# Patient Record
Sex: Female | Born: 1950 | Race: Black or African American | Hispanic: No | Marital: Single | Smoking: Former smoker
Health system: Southern US, Community
[De-identification: ages and names within clinical notes are randomized; demographics above are authoritative.]

## PROBLEM LIST (undated history)

## (undated) DIAGNOSIS — R413 Other amnesia: Secondary | ICD-10-CM

## (undated) DIAGNOSIS — I499 Cardiac arrhythmia, unspecified: Secondary | ICD-10-CM

## (undated) DIAGNOSIS — I341 Nonrheumatic mitral (valve) prolapse: Secondary | ICD-10-CM

## (undated) DIAGNOSIS — E0590 Thyrotoxicosis, unspecified without thyrotoxic crisis or storm: Secondary | ICD-10-CM

## (undated) DIAGNOSIS — J189 Pneumonia, unspecified organism: Secondary | ICD-10-CM

## (undated) DIAGNOSIS — E782 Mixed hyperlipidemia: Secondary | ICD-10-CM

## (undated) DIAGNOSIS — G4730 Sleep apnea, unspecified: Secondary | ICD-10-CM

## (undated) DIAGNOSIS — Z9889 Other specified postprocedural states: Secondary | ICD-10-CM

## (undated) DIAGNOSIS — J45909 Unspecified asthma, uncomplicated: Secondary | ICD-10-CM

## (undated) DIAGNOSIS — K219 Gastro-esophageal reflux disease without esophagitis: Secondary | ICD-10-CM

## (undated) DIAGNOSIS — N8110 Cystocele, unspecified: Secondary | ICD-10-CM

## (undated) DIAGNOSIS — F419 Anxiety disorder, unspecified: Secondary | ICD-10-CM

## (undated) DIAGNOSIS — K635 Polyp of colon: Secondary | ICD-10-CM

## (undated) DIAGNOSIS — R42 Dizziness and giddiness: Secondary | ICD-10-CM

## (undated) DIAGNOSIS — F32A Depression, unspecified: Secondary | ICD-10-CM

## (undated) DIAGNOSIS — R519 Headache, unspecified: Secondary | ICD-10-CM

## (undated) DIAGNOSIS — R911 Solitary pulmonary nodule: Secondary | ICD-10-CM

## (undated) DIAGNOSIS — N840 Polyp of corpus uteri: Secondary | ICD-10-CM

## (undated) DIAGNOSIS — J849 Interstitial pulmonary disease, unspecified: Secondary | ICD-10-CM

## (undated) DIAGNOSIS — I10 Essential (primary) hypertension: Secondary | ICD-10-CM

## (undated) DIAGNOSIS — I4891 Unspecified atrial fibrillation: Secondary | ICD-10-CM

## (undated) DIAGNOSIS — U071 COVID-19: Secondary | ICD-10-CM

## (undated) DIAGNOSIS — E559 Vitamin D deficiency, unspecified: Secondary | ICD-10-CM

## (undated) DIAGNOSIS — T8859XA Other complications of anesthesia, initial encounter: Secondary | ICD-10-CM

## (undated) DIAGNOSIS — J449 Chronic obstructive pulmonary disease, unspecified: Secondary | ICD-10-CM

## (undated) DIAGNOSIS — M1990 Unspecified osteoarthritis, unspecified site: Secondary | ICD-10-CM

## (undated) DIAGNOSIS — N189 Chronic kidney disease, unspecified: Secondary | ICD-10-CM

## (undated) HISTORY — PX: FOOT SURGERY: SHX648

## (undated) HISTORY — PX: COLONOSCOPY: SHX174

## (undated) HISTORY — PX: TUBAL LIGATION: SHX77

## (undated) HISTORY — PX: SHOULDER ARTHROSCOPY: SHX128

## (undated) HISTORY — PX: KNEE SURGERY: SHX244

## (undated) HISTORY — PX: ROTATOR CUFF REPAIR: SHX139

## (undated) HISTORY — PX: KNEE ARTHROSCOPY: SUR90

---

## 2021-02-04 ENCOUNTER — Other Ambulatory Visit: Payer: Self-pay | Admitting: Orthopaedic Surgery

## 2021-02-04 DIAGNOSIS — Z01818 Encounter for other preprocedural examination: Secondary | ICD-10-CM

## 2021-02-12 ENCOUNTER — Encounter (HOSPITAL_COMMUNITY): Payer: Self-pay

## 2021-02-12 NOTE — Patient Instructions (Addendum)
DUE TO COVID-19 ONLY ONE VISITOR IS ALLOWED TO COME WITH YOU AND STAY IN THE WAITING ROOM ONLY DURING PRE OP AND PROCEDURE.   **NO VISITORS ARE ALLOWED IN THE SHORT STAY AREA OR RECOVERY ROOM!!**  IF YOU WILL BE ADMITTED INTO THE HOSPITAL YOU ARE ALLOWED ONLY TWO SUPPORT PEOPLE DURING VISITATION HOURS ONLY (10AM -8PM)   . The support person(s) may change daily. . The support person(s) must pass our screening, gel in and out, and wear a mask at all times, including in the patient's room. . Patients must also wear a mask when staff or their support person are in the room.  No visitors under the age of 68. Any visitor under the age of 68 must be accompanied by an adult.    COVID SWAB TESTING MUST BE COMPLETED ON:  Friday, 02-15-21 @ 12:15 PM   [address] [place], [place] [id number]  (Must self quarantine after testing. Follow instructions on handout.)        Your procedure is scheduled on:  Tuesday, 02-19-21   Report to [facility] Main  Entrance   Report to admitting at 10:00 AM   Call this number if you have problems the morning of surgery [phone]   Do not eat food :After Midnight.   May have liquids until 9:30 AM day of surgery  CLEAR LIQUID DIET  Foods Allowed                                                                     Foods Excluded  Water, Black Coffee and tea, regular and decaf             liquids that you cannot  Plain Jell-O in any flavor  (No red)                                    see through such as: Fruit ices (not with fruit pulp)                                      milk, soups, orange juice              Iced Popsicles (No red)                                      All solid food                                   Apple juices Sports drinks like Gatorade (No red) Lightly seasoned clear broth or consume(fat free) Sugar, honey syrup     Complete one Ensure drink the morning of surgery at 9:30 AM  the day of surgery.     1. The day of surgery:   ? Drink ONE (1) Pre-Surgery Clear Ensure or G2 by am the morning of surgery. Drink in one sitting. Do not sip.  ? This drink was given to you during  your hospital  pre-op appointment visit. ? Nothing else to drink after completing the  Pre-Surgery Clear Ensure or G2.          If you have questions, please contact your surgeon's office.     Oral Hygiene is also important to reduce your risk of infection.                                    Remember - BRUSH YOUR TEETH THE MORNING OF SURGERY WITH YOUR REGULAR TOOTHPASTE   Do NOT smoke after Midnight   Take these medicines the morning of surgery with A SIP OF WATER:  Amlodipine, Citalopram, PTU, Rosuvastatin.  Okay to use Albuterol  inhaler and bring with you day of surgery                                You may not have any metal on your body including hair pins, jewelry, and body piercings             Do not wear make-up, lotions, powders, perfumes/cologne, or deodorant             Do not wear nail polish.  Do not shave  48 hours prior to surgery.     Do not bring valuables to the hospital. [facility] IS NOT RESPONSIBLE   FOR VALUABLES.   Contacts, dentures or bridgework may not be worn into surgery.   Bring small overnight bag day of surgery.   Bring CPAP mask and tubing day of surgery                Please read over the following fact sheets you were given: IF YOU HAVE QUESTIONS ABOUT YOUR PRE OP INSTRUCTIONS PLEASE CALL  [phone] [facility] - Preparing for Surgery Before surgery, you can play an important role.  Because skin is not sterile, your skin needs to be as free of germs as possible.  You can reduce the number of germs on your skin by washing with CHG (chlorahexidine gluconate) soap before surgery.  CHG is an antiseptic cleaner which kills germs and bonds with the skin to continue killing germs even after washing. Please DO NOT use if you have an allergy to CHG or antibacterial soaps.  If your skin becomes  reddened/irritated stop using the CHG and inform your nurse when you arrive at Short Stay. Do not shave (including legs and underarms) for at least 48 hours prior to the first CHG shower.  You may shave your face/neck.  Please follow these instructions carefully:  1.  Shower with CHG Soap the night before surgery and the  morning of surgery.  2.  If you choose to wash your hair, wash your hair first as usual with your normal  shampoo.  3.  After you shampoo, rinse your hair and body thoroughly to remove the shampoo.                             4.  Use CHG as you would any other liquid soap.  You can apply chg directly to the skin and wash.  Gently with a scrungie or clean washcloth.  5.  Apply the CHG Soap to your body ONLY FROM THE NECK DOWN.   Do   not use on  face/ open                           Wound or open sores. Avoid contact with eyes, ears mouth and   genitals (private parts).                       Wash face,  Genitals (private parts) with your normal soap.             6.  Wash thoroughly, paying special attention to the area where your    surgery  will be performed.  7.  Thoroughly rinse your body with warm water from the neck down.  8.  DO NOT shower/wash with your normal soap after using and rinsing off the CHG Soap.                9.  Pat yourself dry with a clean towel.            10.  Wear clean pajamas.            11.  Place clean sheets on your bed the night of your first shower and do not  sleep with pets. Day of Surgery : Do not apply any lotions/deodorants the morning of surgery.  Please wear clean clothes to the hospital/surgery center.  FAILURE TO FOLLOW THESE INSTRUCTIONS MAY RESULT IN THE CANCELLATION OF YOUR SURGERY  PATIENT SIGNATURE_________________________________  NURSE SIGNATURE__________________________________  ________________________________________________________________________   Rogelia Mire  An incentive spirometer is a tool that can help  keep your lungs clear and active. This tool measures how well you are filling your lungs with each breath. Taking long deep breaths may help reverse or decrease the chance of developing breathing (pulmonary) problems (especially infection) following:  A long period of time when you are unable to move or be active. BEFORE THE PROCEDURE   If the spirometer includes an indicator to show your best effort, your nurse or respiratory therapist will set it to a desired goal.  If possible, sit up straight or lean slightly forward. Try not to slouch.  Hold the incentive spirometer in an upright position. INSTRUCTIONS FOR USE  1. Sit on the edge of your bed if possible, or sit up as far as you can in bed or on a chair. 2. Hold the incentive spirometer in an upright position. 3. Breathe out normally. 4. Place the mouthpiece in your mouth and seal your lips tightly around it. 5. Breathe in slowly and as deeply as possible, raising the piston or the ball toward the top of the column. 6. Hold your breath for 3-5 seconds or for as long as possible. Allow the piston or ball to fall to the bottom of the column. 7. Remove the mouthpiece from your mouth and breathe out normally. 8. Rest for a few seconds and repeat Steps 1 through 7 at least 10 times every 1-2 hours when you are awake. Take your time and take a few normal breaths between deep breaths. 9. The spirometer may include an indicator to show your best effort. Use the indicator as a goal to work toward during each repetition. 10. After each set of 10 deep breaths, practice coughing to be sure your lungs are clear. If you have an incision (the cut made at the time of surgery), support your incision when coughing by placing a pillow or rolled up towels firmly against it. Once you are able to get out  of bed, walk around indoors and cough well. You may stop using the incentive spirometer when instructed by your caregiver.  RISKS AND COMPLICATIONS  Take your  time so you do not get dizzy or light-headed.  If you are in pain, you may need to take or ask for pain medication before doing incentive spirometry. It is harder to take a deep breath if you are having pain. AFTER USE  Rest and breathe slowly and easily.  It can be helpful to keep track of a log of your progress. Your caregiver can provide you with a simple table to help with this. If you are using the spirometer at home, follow these instructions: SEEK MEDICAL CARE IF:   You are having difficultly using the spirometer.  You have trouble using the spirometer as often as instructed.  Your pain medication is not giving enough relief while using the spirometer.  You develop fever of 100.5 F (38.1 C) or higher. SEEK IMMEDIATE MEDICAL CARE IF:   You cough up bloody sputum that had not been present before.  You develop fever of 102 F (38.9 C) or greater.  You develop worsening pain at or near the incision site. MAKE SURE YOU:   Understand these instructions.  Will watch your condition.  Will get help right away if you are not doing well or get worse. Document Released: 02/09/2007 Document Revised: 12/22/2011 Document Reviewed: 04/12/2007 [employer] Patient Information 2014 [employer], [place].   ________________________________________________________________________  WHAT IS A BLOOD TRANSFUSION? Blood Transfusion Information  A transfusion is the replacement of blood or some of its parts. Blood is made up of multiple cells which provide different functions.  Red blood cells carry oxygen and are used for blood loss replacement.  White blood cells fight against infection.  Platelets control bleeding.  Plasma helps clot blood.  Other blood products are available for specialized needs, such as hemophilia or other clotting disorders. BEFORE THE TRANSFUSION  Who gives blood for transfusions?   Healthy volunteers who are fully evaluated to make sure their blood is safe. This  is blood bank blood. Transfusion therapy is the safest it has ever been in the practice of medicine. Before blood is taken from a donor, a complete history is taken to make sure that person has no history of diseases nor engages in risky social behavior (examples are intravenous drug use or sexual activity with multiple partners). The donor's travel history is screened to minimize risk of transmitting infections, such as malaria. The donated blood is tested for signs of infectious diseases, such as HIV and hepatitis. The blood is then tested to be sure it is compatible with you in order to minimize the chance of a transfusion reaction. If you or a relative donates blood, this is often done in anticipation of surgery and is not appropriate for emergency situations. It takes many days to process the donated blood. RISKS AND COMPLICATIONS Although transfusion therapy is very safe and saves many lives, the main dangers of transfusion include:   Getting an infectious disease.  Developing a transfusion reaction. This is an allergic reaction to something in the blood you were given. Every precaution is taken to prevent this. The decision to have a blood transfusion has been considered carefully by your caregiver before blood is given. Blood is not given unless the benefits outweigh the risks. AFTER THE TRANSFUSION  Right after receiving a blood transfusion, you will usually feel much better and more energetic. This is especially true if your red  blood cells have gotten low (anemic). The transfusion raises the level of the red blood cells which carry oxygen, and this usually causes an energy increase.  The nurse administering the transfusion will monitor you carefully for complications. HOME CARE INSTRUCTIONS  No special instructions are needed after a transfusion. You may find your energy is better. Speak with your caregiver about any limitations on activity for underlying diseases you may have. SEEK  MEDICAL CARE IF:   Your condition is not improving after your transfusion.  You develop redness or irritation at the intravenous (IV) site. SEEK IMMEDIATE MEDICAL CARE IF:  Any of the following symptoms occur over the next 12 hours:  Shaking chills.  You have a temperature by mouth above 102 F (38.9 C), not controlled by medicine.  Chest, back, or muscle pain.  People around you feel you are not acting correctly or are confused.  Shortness of breath or difficulty breathing.  Dizziness and fainting.  You get a rash or develop hives.  You have a decrease in urine output.  Your urine turns a dark color or changes to pink, red, or brown. Any of the following symptoms occur over the next 10 days:  You have a temperature by mouth above 102 F (38.9 C), not controlled by medicine.  Shortness of breath.  Weakness after normal activity.  The white part of the eye turns yellow (jaundice).  You have a decrease in the amount of urine or are urinating less often.  Your urine turns a dark color or changes to pink, red, or brown. Document Released: 09/26/2000 Document Revised: 12/22/2011 Document Reviewed: 05/15/2008 [facility] Patient Information 2014 [place], [place].  _______________________________________________________________________

## 2021-02-12 NOTE — Progress Notes (Addendum)
COVID Vaccine Completed:  x3 Date COVID Vaccine completed: Has received booster: 08-29-20 COVID vaccine manufacturer: Pfizer     Date of COVID positive in last 90 days:  N/A  PCP - Edmonia Lynch in [place] Cardiologist - Landry Corporal, MD  Pulmonologist - Eual Fines in [facility]  Chest x-ray - 02-13-21 Epic EKG - 02-13-21 at Dr. Weber Cooks.  On chart Stress Test - 2017 Care Everywhere  ECHO - 03-14-20 Care Everywhere Cardiac Cath - 2019 Care Everywhere Pacemaker/ICD device last checked: Spinal Cord Stimulator: 30 day event monitor - 2019 Care Everywhere Cardiac MRI - 2019 Care Everywhere  Sleep Study - yes, +sleep apnea CPAP - Yes  Fasting Blood Sugar - N/A Checks Blood Sugar _____ times a day  Blood Thinner Instructions: Aspirin Instructions:  ASA 81 mg.  Patient remains on this. Last Dose:  Activity level:  Can go up a flight of stairs and perform activities of daily living without stopping and without symptoms of chest pain.  Patient does have SOB with asthma flares but denies at PAT.     Anesthesia review: Afib, s/p ablation 2019.   MVP, HTN, CKD, interstitial lung disease, asthma  Patient denies shortness of breath, fever, cough and chest pain at PAT appointment   Patient verbalized understanding of instructions that were given to them at the PAT appointment. Patient was also instructed that they will need to review over the PAT instructions again at home before surgery.

## 2021-02-13 ENCOUNTER — Encounter (HOSPITAL_COMMUNITY)
Admission: RE | Admit: 2021-02-13 | Discharge: 2021-02-13 | Disposition: A | Discharge status: Home / Self Care | Payer: Medicare Other | Source: Ambulatory Visit | Attending: Orthopaedic Surgery | Admitting: Orthopaedic Surgery

## 2021-02-13 ENCOUNTER — Other Ambulatory Visit (HOSPITAL_COMMUNITY)
Admission: RE | Admit: 2021-02-13 | Discharge: 2021-02-13 | Disposition: A | Discharge status: Home / Self Care | Payer: Medicare Other | Source: Ambulatory Visit

## 2021-02-13 ENCOUNTER — Other Ambulatory Visit: Payer: Self-pay

## 2021-02-13 ENCOUNTER — Encounter (HOSPITAL_COMMUNITY): Payer: Self-pay

## 2021-02-13 ENCOUNTER — Ambulatory Visit (HOSPITAL_COMMUNITY)
Admission: RE | Admit: 2021-02-13 | Discharge: 2021-02-13 | Disposition: A | Discharge status: Home / Self Care | Payer: Medicare Other | Source: Ambulatory Visit | Attending: Orthopaedic Surgery | Admitting: Orthopaedic Surgery

## 2021-02-13 DIAGNOSIS — Z01818 Encounter for other preprocedural examination: Secondary | ICD-10-CM | POA: Insufficient documentation

## 2021-02-13 DIAGNOSIS — Z20822 Contact with and (suspected) exposure to covid-19: Secondary | ICD-10-CM | POA: Insufficient documentation

## 2021-02-13 DIAGNOSIS — Z01812 Encounter for preprocedural laboratory examination: Secondary | ICD-10-CM | POA: Insufficient documentation

## 2021-02-13 HISTORY — DX: Nonrheumatic mitral (valve) prolapse: I34.1

## 2021-02-13 HISTORY — DX: Other amnesia: R41.3

## 2021-02-13 HISTORY — DX: Mixed hyperlipidemia: E78.2

## 2021-02-13 HISTORY — DX: Unspecified osteoarthritis, unspecified site: M19.90

## 2021-02-13 HISTORY — DX: Chronic kidney disease, unspecified: N18.9

## 2021-02-13 HISTORY — DX: Vitamin D deficiency, unspecified: E55.9

## 2021-02-13 HISTORY — DX: Polyp of colon: K63.5

## 2021-02-13 HISTORY — DX: Dizziness and giddiness: R42

## 2021-02-13 HISTORY — DX: Other specified postprocedural states: Z98.890

## 2021-02-13 HISTORY — DX: Unspecified atrial fibrillation: I48.91

## 2021-02-13 HISTORY — DX: Interstitial pulmonary disease, unspecified: J84.9

## 2021-02-13 HISTORY — DX: Other complications of anesthesia, initial encounter: T88.59XA

## 2021-02-13 HISTORY — DX: Headache, unspecified: R51.9

## 2021-02-13 HISTORY — DX: Thyrotoxicosis, unspecified without thyrotoxic crisis or storm: E05.90

## 2021-02-13 HISTORY — DX: Sleep apnea, unspecified: G47.30

## 2021-02-13 HISTORY — DX: COVID-19: U07.1

## 2021-02-13 HISTORY — DX: Cardiac arrhythmia, unspecified: I49.9

## 2021-02-13 HISTORY — DX: Cystocele, unspecified: N81.10

## 2021-02-13 HISTORY — DX: Unspecified asthma, uncomplicated: J45.909

## 2021-02-13 HISTORY — DX: Depression, unspecified: F32.A

## 2021-02-13 HISTORY — DX: Polyp of corpus uteri: N84.0

## 2021-02-13 HISTORY — DX: Chronic obstructive pulmonary disease, unspecified: J44.9

## 2021-02-13 HISTORY — DX: Anxiety disorder, unspecified: F41.9

## 2021-02-13 HISTORY — DX: Pneumonia, unspecified organism: J18.9

## 2021-02-13 HISTORY — DX: Essential (primary) hypertension: I10

## 2021-02-13 HISTORY — DX: Solitary pulmonary nodule: R91.1

## 2021-02-13 HISTORY — DX: Gastro-esophageal reflux disease without esophagitis: K21.9

## 2021-02-13 LAB — COMPREHENSIVE METABOLIC PANEL
ALT: 11 U/L (ref 0–44)
AST: 20 U/L (ref 15–41)
Albumin: 3.7 g/dL (ref 3.5–5.0)
Alkaline Phosphatase: 59 U/L (ref 38–126)
Anion gap: 9 (ref 5–15)
BUN: 15 mg/dL (ref 8–23)
CO2: 23 mmol/L (ref 22–32)
Calcium: 9.3 mg/dL (ref 8.9–10.3)
Chloride: 108 mmol/L (ref 98–111)
Creatinine, Ser: 1.2 mg/dL — ABNORMAL HIGH (ref 0.44–1.00)
GFR, Estimated: 49 mL/min — ABNORMAL LOW (ref >60)
Glucose, Bld: 101 mg/dL — ABNORMAL HIGH (ref 70–99)
Potassium: 4 mmol/L (ref 3.5–5.1)
Sodium: 140 mmol/L (ref 135–145)
Total Bilirubin: 0.6 mg/dL (ref 0.3–1.2)
Total Protein: 6.8 g/dL (ref 6.5–8.1)

## 2021-02-13 LAB — PROTIME-INR
INR: 1 (ref 0.8–1.2)
Prothrombin Time: 12.9 seconds (ref 11.4–15.2)

## 2021-02-13 LAB — CBC WITH DIFFERENTIAL/PLATELET
Abs Immature Granulocytes: 0.02 10*3/uL (ref 0.00–0.07)
Basophils Absolute: 0.1 10*3/uL (ref 0.0–0.1)
Basophils Relative: 1 %
Eosinophils Absolute: 0.2 10*3/uL (ref 0.0–0.5)
Eosinophils Relative: 2 %
HCT: 44.8 % (ref 36.0–46.0)
Hemoglobin: 13.3 g/dL (ref 12.0–15.0)
Immature Granulocytes: 0 %
Lymphocytes Relative: 24 %
Lymphs Abs: 1.8 10*3/uL (ref 0.7–4.0)
MCH: 25.9 pg — ABNORMAL LOW (ref 26.0–34.0)
MCHC: 29.7 g/dL — ABNORMAL LOW (ref 30.0–36.0)
MCV: 87.2 fL (ref 80.0–100.0)
Monocytes Absolute: 0.5 10*3/uL (ref 0.1–1.0)
Monocytes Relative: 6 %
Neutro Abs: 4.7 10*3/uL (ref 1.7–7.7)
Neutrophils Relative %: 67 %
Platelets: 226 10*3/uL (ref 150–400)
RBC: 5.14 MIL/uL — ABNORMAL HIGH (ref 3.87–5.11)
RDW: 13.3 % (ref 11.5–15.5)
WBC: 7.2 10*3/uL (ref 4.0–10.5)
nRBC: 0 % (ref 0.0–0.2)

## 2021-02-13 LAB — URINALYSIS, ROUTINE W REFLEX MICROSCOPIC
Bilirubin Urine: NEGATIVE
Glucose, UA: NEGATIVE mg/dL
Hgb urine dipstick: NEGATIVE
Ketones, ur: NEGATIVE mg/dL
Leukocytes,Ua: NEGATIVE
Nitrite: NEGATIVE
Protein, ur: NEGATIVE mg/dL
Specific Gravity, Urine: 1.026 (ref 1.005–1.030)
pH: 5 (ref 5.0–8.0)

## 2021-02-13 LAB — SARS CORONAVIRUS 2 (TAT 6-24 HRS): SARS Coronavirus 2: NEGATIVE

## 2021-02-13 LAB — SURGICAL PCR SCREEN
MRSA, PCR: NEGATIVE
Staphylococcus aureus: NEGATIVE

## 2021-02-13 LAB — APTT: aPTT: 31 seconds (ref 24–36)

## 2021-02-13 NOTE — H&P (Signed)
TOTAL KNEE ADMISSION H&P  Patient is being admitted for left total knee arthroplasty.  Subjective:  Chief Complaint:left knee pain.  HPI: Brandy Miller, 70 y.o. female, has a history of pain and functional disability in the left knee due to arthritis and has failed non-surgical conservative treatments for greater than 12 weeks to includeNSAID's and/or analgesics, corticosteriod injections, viscosupplementation injections, flexibility and strengthening excercises, use of assistive devices and weight reduction as appropriate.  Onset of symptoms was gradual, starting 5 years ago with gradually worsening course since that time. The patient noted no past surgery on the left knee(s).  Patient currently rates pain in the left knee(s) at 10 out of 10 with activity. Patient has night pain, worsening of pain with activity and weight bearing, pain that interferes with activities of daily living, crepitus and joint swelling.  Patient has evidence of subchondral cysts, subchondral sclerosis, periarticular osteophytes and joint space narrowing by imaging studies. There is no active infection.  There are no problems to display for this patient.  Past Medical History:  Diagnosis Date  . Afib (HCC)   . Anxiety   . Arthritis   . Asthma   . Chronic kidney disease   . Colon polyps   . COPD (chronic obstructive pulmonary disease) (HCC)   . Depression   . Dysrhythmia   . Endometrial polyp   . Female cystocele   . GERD (gastroesophageal reflux disease)   . H/O cardiac radiofrequency ablation    2019  . Headache   . Hypertension   . Hyperthyroidism   . ILD (interstitial lung disease) (HCC)   . Memory loss   . Mixed hyperlipidemia   . MVP (mitral valve prolapse)   . Pneumonia   . Pulmonary nodule   . Sleep apnea   . Vertigo   . Vitamin D deficiency       No current facility-administered medications for this encounter.   Current Outpatient Medications  Medication Sig Dispense Refill Last Dose  .  acetaminophen (TYLENOL) 650 MG CR tablet Take 1,300 mg by mouth every 8 (eight) hours as needed for pain.     Marland Kitchen albuterol (VENTOLIN HFA) 108 (90 Base) MCG/ACT inhaler Inhale 1 puff into the lungs every 6 (six) hours as needed for wheezing or shortness of breath.     Marland Kitchen amLODipine (NORVASC) 5 MG tablet Take 5 mg by mouth daily.     Marland Kitchen aspirin EC 81 MG tablet Take 81 mg by mouth daily. Swallow whole.     . cholecalciferol (VITAMIN D3) 25 MCG (1000 UNIT) tablet Take 1,000 Units by mouth in the morning and at bedtime.     . citalopram (CELEXA) 20 MG tablet Take 20 mg by mouth daily.     . meclizine (ANTIVERT) 25 MG tablet Take 25 mg by mouth 3 (three) times daily as needed for dizziness.     . propylthiouracil (PTU) 50 MG tablet Take 50 mg by mouth See admin instructions. Takes daily except Fridays.     . rosuvastatin (CRESTOR) 5 MG tablet Take 5 mg by mouth daily.     . traMADol (ULTRAM) 50 MG tablet Take 50 mg by mouth every 8 (eight) hours as needed for moderate pain.     . vitamin C (ASCORBIC ACID) 500 MG tablet Take 500 mg by mouth daily.      No Known Allergies  Social History   Tobacco Use  . Smoking status: Not on file  . Smokeless tobacco: Not on file  Substance  Use Topics  . Alcohol use: Not on file    No family history on file.   Review of Systems  Musculoskeletal: Positive for arthralgias.       Left knee  All other systems reviewed and are negative.   Objective:  Physical Exam Constitutional:      Appearance: Normal appearance.  HENT:     Head: Normocephalic and atraumatic.     Nose: Nose normal.     Mouth/Throat:     Pharynx: Oropharynx is clear.  Eyes:     Extraocular Movements: Extraocular movements intact.  Cardiovascular:     Rate and Rhythm: Normal rate and regular rhythm.     Pulses: Normal pulses.  Pulmonary:     Effort: Pulmonary effort is normal.  Abdominal:     Palpations: Abdomen is soft.  Musculoskeletal:     Cervical back: Normal range of motion.      Comments: Examination of bilateral knees show range of motion from about 5-100 of flexion.  She has diffuse tenderness palpation along her both joint lines.  Hip range of motion is full and without pain bilaterally.  She has 1+ crepitation and no effusion bilaterally at the knees.  She is neurovascularly intact distally bilaterally.  She walks with assistance from a cane.   Skin:    General: Skin is warm and dry.  Neurological:     General: No focal deficit present.     Mental Status: She is alert and oriented to person, place, and time.  Psychiatric:        Mood and Affect: Mood normal.        Behavior: Behavior normal.        Thought Content: Thought content normal.        Judgment: Judgment normal.     Vital signs in last 24 hours: Temp:  [98.1 F (36.7 C)] 98.1 F (36.7 C) (05/04 1332) Pulse Rate:  [57] 57 (05/04 1332) Resp:  [18] 18 (05/04 1332) SpO2:  [98 %] 98 % (05/04 1332) Weight:  [103.9 kg] 103.9 kg (05/04 1332)  Labs:   Estimated body mass index is 32.86 kg/m as calculated from the following:   Height as of 02/13/21: 5\' 10"  (1.778 m).   Weight as of 02/13/21: 103.9 kg.   Imaging Review Plain radiographs demonstrate severe degenerative joint disease of the left knee(s). The overall alignment isneutral. The bone quality appears to be good for age and reported activity level.      Assessment/Plan:  End stage primary arthritis, left knee   The patient history, physical examination, clinical judgment of the provider and imaging studies are consistent with end stage degenerative joint disease of the left knee(s) and total knee arthroplasty is deemed medically necessary. The treatment options including medical management, injection therapy arthroscopy and arthroplasty were discussed at length. The risks and benefits of total knee arthroplasty were presented and reviewed. The risks due to aseptic loosening, infection, stiffness, patella tracking problems,  thromboembolic complications and other imponderables were discussed. The patient acknowledged the explanation, agreed to proceed with the plan and consent was signed. Patient is being admitted for inpatient treatment for surgery, pain control, PT, OT, prophylactic antibiotics, VTE prophylaxis, progressive ambulation and ADL's and discharge planning. The patient is planning to be discharged home with home health services     Patient's anticipated LOS is less than 2 midnights, meeting these requirements: - Younger than 64 - Lives within 1 hour of care - Has a competent adult at  home to recover with post-op recover - NO history of  - Chronic pain requiring opiods  - Diabetes  - Coronary Artery Disease  - Heart failure  - Heart attack  - Stroke  - DVT/VTE  - Cardiac arrhythmia  - Respiratory Failure/COPD  - Renal failure  - Anemia  - Advanced Liver disease

## 2021-02-14 NOTE — Progress Notes (Signed)
Anesthesia Chart Review   Case: [id number] Date/Time: 02/19/21 1219   Procedure: LEFT TOTAL KNEE ARTHROPLASTY (Left Knee)   Anesthesia type: Spinal   Pre-op diagnosis: LEFT KNEE DEGENERATIVE JOINT DISEASE   Location: [facility] / [facility]   Surgeons: Marcene Corning, MD      DISCUSSION:70 y.o. former smoker with h/o GERD, HTN, sleep apnea s/CPAP, COPD, asthma, PAF (s/p ablation 2019 without recurrence), CKD Stage IIIa, MVP, subclinical hyperthyroidism, left knee djd scheduled for above procedure 02/19/2021 with Dr. Marcene Corning.   Pt last seen by cardiology 02/13/2021. Per OV note, "She is doing well form cardiac rhythm standpoint. Stable arrhythmia burden. Reviewed patient's medications and no changes made. Follow up in a year."  Pt hospitalized for three weeks with COVID pneumonia Jan/Feb of 2022.  Since that time she has experienced shortness of breath with exertion, no home O2 required. Last seen by pulmonology 02/04/2021. Per OV note, "Patient comes for follow-up. Patient says that she is doing okay from a respiratory point of view. Using her Breo and albuterol very infrequently. No increased shortness of breath. No worsening cough. Physical activity limited by bad knees. Going to undergo total knee replacement on the left this coming May. Followed by the the right knee."  "You are cleared for surgery from a pulmonary point of view. Continue Breo 1 puff daily and albuterol 2 puffs as required if she feels shortness of breath and wheezing. Post surgery: -Start ambulating as soon as you are cleared by the doctors and physical therapist. -Use the incentive spirometer when you were in the hospital and after discharge to prevent pneumonia. -Take your CPAP machine with you when you go for surgery as I want you to sleep on that device at night. -Call me if you need anything"  Anticipate pt can proceed with planned procedure barring acute status change.   VS: BP (!) 157/79   Pulse (!) 57   Temp  36.7 C (Oral)   Resp 18   Ht 5\' 10"  (1.778 m)   Wt 103.9 kg   SpO2 98%   BMI 32.86 kg/m   PROVIDERS: Pcp, No , MD is PCP  Vernona Rieger, MD is Cardiologist with Lear Ng, MD is Pulmonologist  LABS: Labs reviewed: Acceptable for surgery. (all labs ordered are listed, but only abnormal results are displayed)  Labs Reviewed  CBC WITH DIFFERENTIAL/PLATELET - Abnormal; Notable for the following components:      Result Value   RBC 5.14 (*)    MCH 25.9 (*)    MCHC 29.7 (*)    All other components within normal limits  COMPREHENSIVE METABOLIC PANEL - Abnormal; Notable for the following components:   Glucose, Bld 101 (*)    Creatinine, Ser 1.20 (*)    GFR, Estimated 49 (*)    All other components within normal limits  SURGICAL PCR SCREEN  PROTIME-INR  APTT  URINALYSIS, ROUTINE W REFLEX MICROSCOPIC  TYPE AND SCREEN     IMAGES: CT Chest 06/02/2020 IMPRESSION:  1. Spectrum of findings compatible with basilar predominant fibrotic  interstitial lung disease without frank honeycombing, without  convincing interval progression. Differential includes fibrotic NSIP  or UIP. Findings are categorized as probable UIP per consensus  guidelines: Diagnosis of Idiopathic Pulmonary Fibrosis: An Official  ATS/ERS/JRS/ALAT Clinical Practice Guideline. Am 06/04/2020 Crit Care  Med Vol 198, Iss 5, [phone], Jun 13 2017.  2. One vessel coronary atherosclerosis.  3. Cholelithiasis.  4. Aortic Atherosclerosis (ICD10-I70.0).  EKG:   CV: Echo 03/14/2020 SUMMARY  Left ventricular systolic function is normal.  LV ejection fraction = 65-70%.  There is no significant valvular stenosis or regurgitation.  There is no comparison study available.    Past Medical History:  Diagnosis Date  . Afib (HCC)   . Anxiety   . Arthritis   . Asthma   . Chronic kidney disease   . Colon polyps   . Complication of anesthesia    Difficulty waking up after shoulder surgery   . COPD (chronic obstructive pulmonary disease) (HCC)    Pt denies, states just asthma  . COVID-19    2021  . Depression   . Dysrhythmia   . Endometrial polyp   . Female cystocele   . GERD (gastroesophageal reflux disease)   . H/O cardiac radiofrequency ablation    2019  . Headache   . Hypertension   . Hyperthyroidism   . ILD (interstitial lung disease) (HCC)   . Memory loss   . Mixed hyperlipidemia   . MVP (mitral valve prolapse)   . Pneumonia   . Pulmonary nodule   . Sleep apnea   . Vertigo   . Vitamin D deficiency     Past Surgical History:  Procedure Laterality Date  . COLONOSCOPY    . FOOT SURGERY    . KNEE ARTHROSCOPY Right    2014  . KNEE SURGERY Bilateral   . ROTATOR CUFF REPAIR Left   . SHOULDER ARTHROSCOPY Bilateral   . TUBAL LIGATION      MEDICATIONS: . acetaminophen (TYLENOL) 650 MG CR tablet  . albuterol (VENTOLIN HFA) 108 (90 Base) MCG/ACT inhaler  . amLODipine (NORVASC) 5 MG tablet  . aspirin EC 81 MG tablet  . cholecalciferol (VITAMIN D3) 25 MCG (1000 UNIT) tablet  . citalopram (CELEXA) 20 MG tablet  . meclizine (ANTIVERT) 25 MG tablet  . propylthiouracil (PTU) 50 MG tablet  . rosuvastatin (CRESTOR) 5 MG tablet  . traMADol (ULTRAM) 50 MG tablet  . vitamin C (ASCORBIC ACID) 500 MG tablet   No current facility-administered medications for this encounter.    Jodell Cipro, PA-C [facility] Pre-Surgical Testing [phone]

## 2021-02-14 NOTE — Anesthesia Preprocedure Evaluation (Addendum)
Anesthesia Evaluation  Patient identified by MRN, date of birth, ID band Patient awake    Reviewed: Allergy & Precautions, NPO status , Patient's Chart, lab work & pertinent test results  Airway Mallampati: II  TM Distance: >3 FB Neck ROM: Full    Dental no notable dental hx. (+) Teeth Intact, Dental Advisory Given   Pulmonary shortness of breath (Pt w idiopathic Pulm fibrosis), asthma , sleep apnea and Continuous Positive Airway Pressure Ventilation , COPD,  COPD inhaler, former smoker,    Pulmonary exam normal breath sounds clear to auscultation       Cardiovascular hypertension, Pt. on medications Normal cardiovascular exam Rhythm:Regular Rate:Normal  Echo 03/14/2020 SUMMARY  Left ventricular systolic function is normal.  LV ejection fraction = 65-70%.  There is no significant valvular stenosis or regurgitation.  There is no comparison study available.    Neuro/Psych  Headaches,    GI/Hepatic Neg liver ROS, GERD  ,  Endo/Other  Hyperthyroidism   Renal/GU Renal InsufficiencyRenal diseaseLab Results      Component                Value               Date                      CREATININE               1.20 (H)            02/13/2021                BUN                      15                  02/13/2021                NA                       140                 02/13/2021                K                        4.0                 02/13/2021                CL                       108                 02/13/2021                CO2                      23                  02/13/2021                Musculoskeletal  (+) Arthritis ,   Abdominal   Peds  Hematology Lab Results      Component                Value  Date                      WBC                      7.2                 02/13/2021                HGB                      13.3                02/13/2021                HCT                      44.8                 02/13/2021                MCV                      87.2                02/13/2021                PLT                      226                 02/13/2021              Anesthesia Other Findings   Reproductive/Obstetrics                           Anesthesia Physical Anesthesia Plan  ASA: III  Anesthesia Plan: Spinal   Post-op Pain Management:  Regional for Post-op pain   Induction:   PONV Risk Score and Plan: 3 and Treatment may vary due to age or medical condition, Ondansetron, Midazolam and Dexamethasone  Airway Management Planned: Nasal Cannula and Natural Airway  Additional Equipment: None  Intra-op Plan:   Post-operative Plan:   Informed Consent: I have reviewed the patients History and Physical, chart, labs and discussed the procedure including the risks, benefits and alternatives for the proposed anesthesia with the patient or authorized representative who has indicated his/her understanding and acceptance.     Dental advisory given  Plan Discussed with: CRNA and Anesthesiologist  Anesthesia Plan Comments: (See PAT note 02/13/2021, Jodell Cipro, PA-C  L Adductor canla plus Spinal)      Anesthesia Quick Evaluation

## 2021-02-15 ENCOUNTER — Other Ambulatory Visit (HOSPITAL_COMMUNITY): Payer: Medicare Other

## 2021-02-18 MED ORDER — TRANEXAMIC ACID 1000 MG/10ML IV SOLN
2000.0000 mg | INTRAVENOUS | Status: DC
  Filled 2021-02-18: qty 20

## 2021-02-18 MED ORDER — BUPIVACAINE LIPOSOME 1.3 % IJ SUSP
20.0000 mL | INTRAMUSCULAR | Status: DC
  Filled 2021-02-18: qty 20

## 2021-02-19 ENCOUNTER — Other Ambulatory Visit: Payer: Self-pay

## 2021-02-19 ENCOUNTER — Encounter (HOSPITAL_COMMUNITY)
Admission: RE | Disposition: A | Discharge status: Home / Self Care | Payer: Self-pay | Source: Other Acute Inpatient Hospital | Attending: Orthopaedic Surgery

## 2021-02-19 ENCOUNTER — Encounter (HOSPITAL_COMMUNITY): Payer: Self-pay | Admitting: Orthopaedic Surgery

## 2021-02-19 ENCOUNTER — Ambulatory Visit (HOSPITAL_COMMUNITY): Payer: Medicare Other | Admitting: Certified Registered"

## 2021-02-19 ENCOUNTER — Observation Stay (HOSPITAL_COMMUNITY)
Admission: RE | Admit: 2021-02-19 | Discharge: 2021-02-21 | Disposition: A | Discharge status: Home / Self Care | Payer: Medicare Other | Source: Other Acute Inpatient Hospital | Attending: Orthopaedic Surgery | Admitting: Orthopaedic Surgery

## 2021-02-19 ENCOUNTER — Ambulatory Visit (HOSPITAL_COMMUNITY): Payer: Medicare Other | Admitting: Physician Assistant

## 2021-02-19 DIAGNOSIS — J449 Chronic obstructive pulmonary disease, unspecified: Secondary | ICD-10-CM | POA: Diagnosis not present

## 2021-02-19 DIAGNOSIS — M1712 Unilateral primary osteoarthritis, left knee: Principal | ICD-10-CM | POA: Diagnosis present

## 2021-02-19 DIAGNOSIS — I129 Hypertensive chronic kidney disease with stage 1 through stage 4 chronic kidney disease, or unspecified chronic kidney disease: Secondary | ICD-10-CM | POA: Diagnosis not present

## 2021-02-19 DIAGNOSIS — Z7982 Long term (current) use of aspirin: Secondary | ICD-10-CM | POA: Diagnosis not present

## 2021-02-19 DIAGNOSIS — N189 Chronic kidney disease, unspecified: Secondary | ICD-10-CM | POA: Diagnosis not present

## 2021-02-19 DIAGNOSIS — Z79899 Other long term (current) drug therapy: Secondary | ICD-10-CM | POA: Diagnosis not present

## 2021-02-19 DIAGNOSIS — J45909 Unspecified asthma, uncomplicated: Secondary | ICD-10-CM | POA: Diagnosis not present

## 2021-02-19 DIAGNOSIS — Z8616 Personal history of COVID-19: Secondary | ICD-10-CM | POA: Diagnosis not present

## 2021-02-19 HISTORY — PX: TOTAL KNEE ARTHROPLASTY: SHX125

## 2021-02-19 LAB — TYPE AND SCREEN
ABO/RH(D): O NEG
Antibody Screen: NEGATIVE

## 2021-02-19 LAB — ABO/RH: ABO/RH(D): O NEG

## 2021-02-19 SURGERY — ARTHROPLASTY, KNEE, TOTAL
Anesthesia: Spinal | Site: Knee | Laterality: Left

## 2021-02-19 MED ORDER — HYDROMORPHONE HCL 1 MG/ML IJ SOLN: 0.2500 mg | INTRAMUSCULAR | Status: DC | PRN

## 2021-02-19 MED ORDER — CEFAZOLIN SODIUM-DEXTROSE 2-4 GM/100ML-% IV SOLN
2.0000 g | Freq: Four times a day (QID) | INTRAVENOUS | Status: AC
  Administered 2021-02-19 – 2021-02-20 (×2): 2 g via INTRAVENOUS
  Filled 2021-02-19 (×2): qty 100

## 2021-02-19 MED ORDER — LACTATED RINGERS IV SOLN: INTRAVENOUS | Status: DC

## 2021-02-19 MED ORDER — CITALOPRAM HYDROBROMIDE 20 MG PO TABS
20.0000 mg | ORAL_TABLET | Freq: Every day | ORAL | Status: DC
  Administered 2021-02-19 – 2021-02-21 (×3): 20 mg via ORAL
  Filled 2021-02-19 (×3): qty 1

## 2021-02-19 MED ORDER — BUPIVACAINE IN DEXTROSE 0.75-8.25 % IT SOLN
INTRATHECAL | Status: DC | PRN
  Administered 2021-02-19: 1.6 mL via INTRATHECAL

## 2021-02-19 MED ORDER — ROSUVASTATIN CALCIUM 5 MG PO TABS
5.0000 mg | ORAL_TABLET | Freq: Every day | ORAL | Status: DC
  Administered 2021-02-20 – 2021-02-21 (×2): 5 mg via ORAL
  Filled 2021-02-19 (×2): qty 1

## 2021-02-19 MED ORDER — POVIDONE-IODINE 10 % EX SWAB
2.0000 "application " | Freq: Once | CUTANEOUS | Status: AC
  Administered 2021-02-19: 2 via TOPICAL

## 2021-02-19 MED ORDER — ONDANSETRON HCL 4 MG/2ML IJ SOLN: 4.0000 mg | Freq: Four times a day (QID) | INTRAMUSCULAR | Status: DC | PRN

## 2021-02-19 MED ORDER — ASPIRIN 81 MG PO CHEW
81.0000 mg | CHEWABLE_TABLET | Freq: Two times a day (BID) | ORAL | Status: DC
  Administered 2021-02-20 – 2021-02-21 (×3): 81 mg via ORAL
  Filled 2021-02-19 (×3): qty 1

## 2021-02-19 MED ORDER — PROPYLTHIOURACIL 50 MG PO TABS
50.0000 mg | ORAL_TABLET | ORAL | Status: DC
  Administered 2021-02-19 – 2021-02-21 (×3): 50 mg via ORAL
  Filled 2021-02-19 (×3): qty 1

## 2021-02-19 MED ORDER — ACETAMINOPHEN 325 MG PO TABS: 325.0000 mg | ORAL_TABLET | Freq: Four times a day (QID) | ORAL | Status: DC | PRN

## 2021-02-19 MED ORDER — TRANEXAMIC ACID-NACL 1000-0.7 MG/100ML-% IV SOLN
1000.0000 mg | Freq: Once | INTRAVENOUS | Status: AC
  Administered 2021-02-19: 1000 mg via INTRAVENOUS
  Filled 2021-02-19: qty 100

## 2021-02-19 MED ORDER — DEXAMETHASONE SODIUM PHOSPHATE 10 MG/ML IJ SOLN
INTRAMUSCULAR | Status: DC | PRN
  Administered 2021-02-19: 4 mg via INTRAVENOUS

## 2021-02-19 MED ORDER — MIDAZOLAM HCL 2 MG/2ML IJ SOLN
1.0000 mg | Freq: Once | INTRAMUSCULAR | Status: AC
  Administered 2021-02-19 (×2): 1 mg via INTRAVENOUS
  Filled 2021-02-19: qty 2

## 2021-02-19 MED ORDER — SODIUM CHLORIDE (PF) 0.9 % IJ SOLN
INTRAMUSCULAR | Status: DC | PRN
  Administered 2021-02-19: 30 mL

## 2021-02-19 MED ORDER — FENTANYL CITRATE (PF) 100 MCG/2ML IJ SOLN
50.0000 ug | Freq: Once | INTRAMUSCULAR | Status: AC
Start: 2021-02-19 — End: 2021-02-19
  Administered 2021-02-19: 50 ug via INTRAVENOUS
  Filled 2021-02-19: qty 2

## 2021-02-19 MED ORDER — OXYCODONE HCL 5 MG PO TABS: 5.0000 mg | ORAL_TABLET | Freq: Once | ORAL | Status: DC | PRN

## 2021-02-19 MED ORDER — METHOCARBAMOL 500 MG PO TABS
500.0000 mg | ORAL_TABLET | Freq: Four times a day (QID) | ORAL | Status: DC | PRN
  Administered 2021-02-19 – 2021-02-21 (×4): 500 mg via ORAL
  Filled 2021-02-19 (×4): qty 1

## 2021-02-19 MED ORDER — TRANEXAMIC ACID-NACL 1000-0.7 MG/100ML-% IV SOLN
1000.0000 mg | INTRAVENOUS | Status: AC
  Administered 2021-02-19: 1000 mg via INTRAVENOUS
  Filled 2021-02-19: qty 100

## 2021-02-19 MED ORDER — DEXAMETHASONE SODIUM PHOSPHATE 10 MG/ML IJ SOLN
INTRAMUSCULAR | Status: AC
  Filled 2021-02-19: qty 1

## 2021-02-19 MED ORDER — ALUM & MAG HYDROXIDE-SIMETH 200-200-20 MG/5ML PO SUSP: 30.0000 mL | ORAL | Status: DC | PRN

## 2021-02-19 MED ORDER — DOCUSATE SODIUM 100 MG PO CAPS
100.0000 mg | ORAL_CAPSULE | Freq: Two times a day (BID) | ORAL | Status: DC
  Administered 2021-02-19 – 2021-02-21 (×4): 100 mg via ORAL
  Filled 2021-02-19 (×4): qty 1

## 2021-02-19 MED ORDER — LIDOCAINE 2% (20 MG/ML) 5 ML SYRINGE
INTRAMUSCULAR | Status: DC | PRN
  Administered 2021-02-19: 40 mg via INTRAVENOUS

## 2021-02-19 MED ORDER — AMLODIPINE BESYLATE 5 MG PO TABS
5.0000 mg | ORAL_TABLET | Freq: Every day | ORAL | Status: DC
  Administered 2021-02-20 – 2021-02-21 (×2): 5 mg via ORAL
  Filled 2021-02-19 (×2): qty 1

## 2021-02-19 MED ORDER — TRANEXAMIC ACID 1000 MG/10ML IV SOLN
INTRAVENOUS | Status: DC | PRN
  Administered 2021-02-19: 2000 mg via TOPICAL

## 2021-02-19 MED ORDER — BISACODYL 5 MG PO TBEC: 5.0000 mg | DELAYED_RELEASE_TABLET | Freq: Every day | ORAL | Status: DC | PRN

## 2021-02-19 MED ORDER — MIDAZOLAM HCL 2 MG/2ML IJ SOLN
INTRAMUSCULAR | Status: AC
  Filled 2021-02-19: qty 2

## 2021-02-19 MED ORDER — KETOROLAC TROMETHAMINE 15 MG/ML IJ SOLN
7.5000 mg | Freq: Four times a day (QID) | INTRAMUSCULAR | Status: AC
  Administered 2021-02-19 – 2021-02-20 (×4): 7.5 mg via INTRAVENOUS
  Filled 2021-02-19 (×3): qty 1

## 2021-02-19 MED ORDER — ACETAMINOPHEN 500 MG PO TABS
500.0000 mg | ORAL_TABLET | Freq: Four times a day (QID) | ORAL | Status: AC
  Administered 2021-02-19 – 2021-02-20 (×2): 500 mg via ORAL
  Filled 2021-02-19 (×2): qty 1

## 2021-02-19 MED ORDER — OXYCODONE HCL 5 MG/5ML PO SOLN: 5.0000 mg | Freq: Once | ORAL | Status: DC | PRN

## 2021-02-19 MED ORDER — STERILE WATER FOR IRRIGATION IR SOLN
Status: DC | PRN
  Administered 2021-02-19 (×2): 1000 mL

## 2021-02-19 MED ORDER — PROPOFOL 1000 MG/100ML IV EMUL
INTRAVENOUS | Status: AC
  Filled 2021-02-19: qty 100

## 2021-02-19 MED ORDER — METHOCARBAMOL 1000 MG/10ML IJ SOLN
500.0000 mg | Freq: Four times a day (QID) | INTRAVENOUS | Status: DC | PRN
  Filled 2021-02-19: qty 5

## 2021-02-19 MED ORDER — MIDAZOLAM HCL 2 MG/2ML IJ SOLN
INTRAMUSCULAR | Status: DC | PRN
  Administered 2021-02-19: .5 mg via INTRAVENOUS

## 2021-02-19 MED ORDER — ONDANSETRON HCL 4 MG/2ML IJ SOLN: 4.0000 mg | Freq: Once | INTRAMUSCULAR | Status: DC | PRN

## 2021-02-19 MED ORDER — ONDANSETRON HCL 4 MG PO TABS: 4.0000 mg | ORAL_TABLET | Freq: Four times a day (QID) | ORAL | Status: DC | PRN

## 2021-02-19 MED ORDER — PHENOL 1.4 % MT LIQD: 1.0000 | OROMUCOSAL | Status: DC | PRN

## 2021-02-19 MED ORDER — HYDROCODONE-ACETAMINOPHEN 5-325 MG PO TABS
1.0000 | ORAL_TABLET | ORAL | Status: DC | PRN
  Administered 2021-02-21: 2 via ORAL
  Filled 2021-02-19 (×2): qty 2

## 2021-02-19 MED ORDER — ONDANSETRON HCL 4 MG/2ML IJ SOLN
INTRAMUSCULAR | Status: AC
  Filled 2021-02-19: qty 2

## 2021-02-19 MED ORDER — PROPOFOL 500 MG/50ML IV EMUL
INTRAVENOUS | Status: DC | PRN
  Administered 2021-02-19: 70 ug/kg/min via INTRAVENOUS

## 2021-02-19 MED ORDER — DROPERIDOL 2.5 MG/ML IJ SOLN: 0.6250 mg | Freq: Once | INTRAMUSCULAR | Status: DC | PRN

## 2021-02-19 MED ORDER — FENTANYL CITRATE (PF) 100 MCG/2ML IJ SOLN
INTRAMUSCULAR | Status: AC
  Filled 2021-02-19: qty 2

## 2021-02-19 MED ORDER — ONDANSETRON HCL 4 MG/2ML IJ SOLN
INTRAMUSCULAR | Status: DC | PRN
  Administered 2021-02-19: 4 mg via INTRAVENOUS

## 2021-02-19 MED ORDER — ACETAMINOPHEN 10 MG/ML IV SOLN: 1000.0000 mg | Freq: Once | INTRAVENOUS | Status: DC | PRN

## 2021-02-19 MED ORDER — MORPHINE SULFATE (PF) 2 MG/ML IV SOLN
0.5000 mg | INTRAVENOUS | Status: DC | PRN
  Administered 2021-02-19: 1 mg via INTRAVENOUS
  Filled 2021-02-19: qty 1

## 2021-02-19 MED ORDER — DIPHENHYDRAMINE HCL 12.5 MG/5ML PO ELIX
12.5000 mg | ORAL_SOLUTION | ORAL | Status: DC | PRN
Start: 2021-02-19 — End: 2021-02-21
  Administered 2021-02-20: 12.5 mg via ORAL
  Filled 2021-02-19: qty 5

## 2021-02-19 MED ORDER — ORAL CARE MOUTH RINSE: 15.0000 mL | Freq: Once | OROMUCOSAL | Status: AC

## 2021-02-19 MED ORDER — BUPIVACAINE-EPINEPHRINE (PF) 0.25% -1:200000 IJ SOLN
INTRAMUSCULAR | Status: AC
  Filled 2021-02-19: qty 30

## 2021-02-19 MED ORDER — BUPIVACAINE-EPINEPHRINE 0.5% -1:200000 IJ SOLN
INTRAMUSCULAR | Status: DC | PRN
  Administered 2021-02-19: 30 mL

## 2021-02-19 MED ORDER — CLONIDINE HCL (ANALGESIA) 100 MCG/ML EP SOLN
EPIDURAL | Status: DC | PRN
  Administered 2021-02-19: 100 ug

## 2021-02-19 MED ORDER — HYDROCODONE-ACETAMINOPHEN 7.5-325 MG PO TABS
1.0000 | ORAL_TABLET | ORAL | Status: DC | PRN
  Administered 2021-02-19: 1 via ORAL
  Administered 2021-02-19 – 2021-02-20 (×4): 2 via ORAL
  Filled 2021-02-19 (×4): qty 2
  Filled 2021-02-19: qty 1

## 2021-02-19 MED ORDER — METOCLOPRAMIDE HCL 5 MG PO TABS: 5.0000 mg | ORAL_TABLET | Freq: Three times a day (TID) | ORAL | Status: DC | PRN

## 2021-02-19 MED ORDER — PROPOFOL 10 MG/ML IV BOLUS
INTRAVENOUS | Status: AC
  Filled 2021-02-19: qty 20

## 2021-02-19 MED ORDER — BUPIVACAINE LIPOSOME 1.3 % IJ SUSP
INTRAMUSCULAR | Status: DC | PRN
Start: 2021-02-19 — End: 2021-02-19
  Administered 2021-02-19: 20 mL

## 2021-02-19 MED ORDER — PROPOFOL 500 MG/50ML IV EMUL
INTRAVENOUS | Status: AC
  Filled 2021-02-19: qty 50

## 2021-02-19 MED ORDER — MECLIZINE HCL 25 MG PO TABS: 25.0000 mg | ORAL_TABLET | Freq: Three times a day (TID) | ORAL | Status: DC | PRN

## 2021-02-19 MED ORDER — CEFAZOLIN SODIUM-DEXTROSE 2-4 GM/100ML-% IV SOLN
2.0000 g | INTRAVENOUS | Status: AC
  Administered 2021-02-19: 2 g via INTRAVENOUS
  Filled 2021-02-19: qty 100

## 2021-02-19 MED ORDER — ROPIVACAINE HCL 7.5 MG/ML IJ SOLN
INTRAMUSCULAR | Status: DC | PRN
  Administered 2021-02-19: 20 mL via PERINEURAL

## 2021-02-19 MED ORDER — ALBUTEROL SULFATE HFA 108 (90 BASE) MCG/ACT IN AERS: 1.0000 | INHALATION_SPRAY | Freq: Four times a day (QID) | RESPIRATORY_TRACT | Status: DC | PRN

## 2021-02-19 MED ORDER — FENTANYL CITRATE (PF) 100 MCG/2ML IJ SOLN
INTRAMUSCULAR | Status: DC | PRN
  Administered 2021-02-19: 25 ug via INTRAVENOUS

## 2021-02-19 MED ORDER — PHENYLEPHRINE HCL (PRESSORS) 10 MG/ML IV SOLN
INTRAVENOUS | Status: AC
  Filled 2021-02-19: qty 1

## 2021-02-19 MED ORDER — METOCLOPRAMIDE HCL 5 MG/ML IJ SOLN: 5.0000 mg | Freq: Three times a day (TID) | INTRAMUSCULAR | Status: DC | PRN

## 2021-02-19 MED ORDER — MENTHOL 3 MG MT LOZG: 1.0000 | LOZENGE | OROMUCOSAL | Status: DC | PRN

## 2021-02-19 MED ORDER — CHLORHEXIDINE GLUCONATE 0.12 % MT SOLN
15.0000 mL | Freq: Once | OROMUCOSAL | Status: AC
  Administered 2021-02-19: 15 mL via OROMUCOSAL

## 2021-02-19 MED ORDER — 0.9 % SODIUM CHLORIDE (POUR BTL) OPTIME
TOPICAL | Status: DC | PRN
  Administered 2021-02-19: 1000 mL

## 2021-02-19 SURGICAL SUPPLY — 55 items
ATTUNE PS FEM LT SZ 5 CEM KNEE (Femur) ×2 IMPLANT
BAG DECANTER FOR FLEXI CONT (MISCELLANEOUS) ×2 IMPLANT
BAG SPEC THK2 15X12 ZIP CLS (MISCELLANEOUS) ×1
BAG ZIPLOCK 12X15 (MISCELLANEOUS) ×2 IMPLANT
BASE TIBIA ATTUNE KNEE SYS SZ6 (Knees) ×1 IMPLANT
BLADE SAGITTAL 25.0X1.19X90 (BLADE) ×2 IMPLANT
BLADE SAW SGTL 11.0X1.19X90.0M (BLADE) ×2 IMPLANT
BNDG ELASTIC 6X5.8 VLCR STR LF (GAUZE/BANDAGES/DRESSINGS) ×2 IMPLANT
BOOTIES KNEE HIGH SLOAN (MISCELLANEOUS) ×2 IMPLANT
BOWL SMART MIX CTS (DISPOSABLE) ×2 IMPLANT
BSPLAT TIB 6 CMNT ROT PLAT STR (Knees) ×1 IMPLANT
CEMENT HV SMART SET (Cement) ×4 IMPLANT
COVER SURGICAL LIGHT HANDLE (MISCELLANEOUS) ×2 IMPLANT
COVER WAND RF STERILE (DRAPES) ×2 IMPLANT
CUFF TOURN SGL QUICK 34 (TOURNIQUET CUFF) ×2
CUFF TRNQT CYL 34X4.125X (TOURNIQUET CUFF) ×1 IMPLANT
DECANTER SPIKE VIAL GLASS SM (MISCELLANEOUS) ×4 IMPLANT
DRAPE ORTHO SPLIT 77X108 STRL (DRAPES)
DRAPE SHEET LG 3/4 BI-LAMINATE (DRAPES) ×2 IMPLANT
DRAPE SURG ORHT 6 SPLT 77X108 (DRAPES) IMPLANT
DRAPE TOP 10253 STERILE (DRAPES) ×2 IMPLANT
DRAPE U-SHAPE 47X51 STRL (DRAPES) ×2 IMPLANT
DRSG AQUACEL AG ADV 3.5X10 (GAUZE/BANDAGES/DRESSINGS) ×2 IMPLANT
DURAPREP 26ML APPLICATOR (WOUND CARE) ×4 IMPLANT
ELECT REM PT RETURN 15FT ADLT (MISCELLANEOUS) ×2 IMPLANT
GLOVE SRG 8 PF TXTR STRL LF DI (GLOVE) ×2 IMPLANT
GLOVE SURG ENC MOIS LTX SZ8 (GLOVE) ×4 IMPLANT
GLOVE SURG UNDER POLY LF SZ8 (GLOVE) ×4
GOWN STRL REUS W/TWL XL LVL3 (GOWN DISPOSABLE) ×4 IMPLANT
HANDPIECE INTERPULSE COAX TIP (DISPOSABLE) ×2
HOLDER FOLEY CATH W/STRAP (MISCELLANEOUS) IMPLANT
HOOD PEEL AWAY FLYTE STAYCOOL (MISCELLANEOUS) ×6 IMPLANT
INSERT TIB ATTUNE RP SZ5X16 (Insert) ×2 IMPLANT
KIT TURNOVER KIT A (KITS) ×2 IMPLANT
MANIFOLD NEPTUNE II (INSTRUMENTS) ×2 IMPLANT
NEEDLE HYPO 22GX1.5 SAFETY (NEEDLE) ×4 IMPLANT
NS IRRIG 1000ML POUR BTL (IV SOLUTION) ×2 IMPLANT
PACK TOTAL KNEE CUSTOM (KITS) ×2 IMPLANT
PAD ARMBOARD 7.5X6 YLW CONV (MISCELLANEOUS) ×2 IMPLANT
PATELLA MEDIAL ATTUN 35MM KNEE (Knees) ×2 IMPLANT
PENCIL SMOKE EVACUATOR (MISCELLANEOUS) IMPLANT
PIN DRILL FIX HALF THREAD (BIT) ×2 IMPLANT
PIN STEINMAN FIXATION KNEE (PIN) ×2 IMPLANT
PROTECTOR NERVE ULNAR (MISCELLANEOUS) ×2 IMPLANT
SET HNDPC FAN SPRY TIP SCT (DISPOSABLE) ×1 IMPLANT
SUT ETHIBOND NAB CT1 #1 30IN (SUTURE) ×4 IMPLANT
SUT VIC AB 0 CT1 36 (SUTURE) ×2 IMPLANT
SUT VIC AB 2-0 CT1 27 (SUTURE) ×2
SUT VIC AB 2-0 CT1 TAPERPNT 27 (SUTURE) ×1 IMPLANT
SUT VICRYL AB 3-0 FS1 BRD 27IN (SUTURE) ×2 IMPLANT
SUT VLOC 180 0 24IN GS25 (SUTURE) ×2 IMPLANT
TIBIA ATTUNE KNEE SYS BASE SZ6 (Knees) ×2 IMPLANT
TRAY FOLEY MTR SLVR 16FR STAT (SET/KITS/TRAYS/PACK) IMPLANT
WATER STERILE IRR 1000ML POUR (IV SOLUTION) ×2 IMPLANT
WRAP KNEE MAXI GEL POST OP (GAUZE/BANDAGES/DRESSINGS) ×2 IMPLANT

## 2021-02-19 NOTE — Plan of Care (Signed)
  Problem: Education: Goal: Knowledge of General Education information will improve Description: Including pain rating scale, medication(s)/side effects and non-pharmacologic comfort measures Outcome: Progressing   Problem: Activity: Goal: Risk for activity intolerance will decrease Outcome: Progressing   Problem: Nutrition: Goal: Adequate nutrition will be maintained Outcome: Progressing   Problem: Elimination: Goal: Will not experience complications related to bowel motility Outcome: Progressing   Problem: Education: Goal: Knowledge of the prescribed therapeutic regimen will improve Outcome: Progressing   Problem: Activity: Goal: Ability to avoid complications of mobility impairment will improve Outcome: Progressing   Problem: Pain Management: Goal: Pain level will decrease with appropriate interventions Outcome: Progressing

## 2021-02-19 NOTE — Interval H&P Note (Signed)
History and Physical Interval Note:  02/19/2021 11:31 AM  Brandy Miller  has presented today for surgery, with the diagnosis of LEFT KNEE DEGENERATIVE JOINT DISEASE.  The various methods of treatment have been discussed with the patient and family. After consideration of risks, benefits and other options for treatment, the patient has consented to  Procedure(s): LEFT TOTAL KNEE ARTHROPLASTY (Left) as a surgical intervention.  The patient's history has been reviewed, patient examined, no change in status, stable for surgery.  I have reviewed the patient's chart and labs.  Questions were answered to the patient's satisfaction.     Velna Ochs

## 2021-02-19 NOTE — Anesthesia Procedure Notes (Signed)
Spinal  Patient location during procedure: OR Start time: 02/19/2021 12:30 PM Reason for block: surgical anesthesia Staffing Performed: resident/CRNA  Anesthesiologist: Barnet Glasgow, MD Resident/CRNA: Eben Burow, CRNA Preanesthetic Checklist Completed: patient identified, IV checked, site marked, risks and benefits discussed, surgical consent, monitors and equipment checked, pre-op evaluation and timeout performed Spinal Block Patient position: sitting Prep: DuraPrep and site prepped and draped Patient monitoring: heart rate, continuous pulse ox and blood pressure Approach: midline Location: L3-4 Injection technique: single-shot Needle Needle type: Pencan  Needle gauge: 24 G Needle length: 9 cm Assessment Sensory level: T4 Events: CSF return Additional Notes Pt placed in sitting position, spinal kit expiration date checked and verified, + CSF, - heme, pt tolerated well. Dr Valma Cava present and supervising throughout SAB placement.

## 2021-02-19 NOTE — Anesthesia Postprocedure Evaluation (Signed)
Anesthesia Post Note  Patient: Brandy Miller  Procedure(s) Performed: LEFT TOTAL KNEE ARTHROPLASTY (Left Knee)     Patient location during evaluation: Nursing Unit Anesthesia Type: Spinal Level of consciousness: oriented and awake and alert Pain management: pain level controlled Vital Signs Assessment: post-procedure vital signs reviewed and stable Respiratory status: spontaneous breathing and respiratory function stable Cardiovascular status: blood pressure returned to baseline and stable Postop Assessment: no headache, no backache, no apparent nausea or vomiting and patient able to bend at knees Anesthetic complications: no   No complications documented.  Last Vitals:  Vitals:   02/19/21 1430 02/19/21 1445  BP: (!) 168/78 (!) 143/65  Pulse: (!) 47 (!) 45  Resp: 10 13  Temp:    SpO2: 99% 100%    Last Pain:  Vitals:   02/19/21 1445  TempSrc:   PainSc: Asleep                 Trevor Iha

## 2021-02-19 NOTE — Progress Notes (Signed)
Assisted Dr. Druscilla Brownie with Left Knee Adductor Block. Side rails up, monitors on throughout procedure. See vital signs in flow sheet. Tolerated Procedure well.

## 2021-02-19 NOTE — Plan of Care (Signed)
  Problem: Education: Goal: Knowledge of General Education information will improve Description: Including pain rating scale, medication(s)/side effects and non-pharmacologic comfort measures Outcome: Progressing   Problem: Activity: Goal: Risk for activity intolerance will decrease Outcome: Progressing   Problem: Nutrition: Goal: Adequate nutrition will be maintained 02/19/2021 1704 by Marni Griffon I, RN Outcome: Progressing 02/19/2021 1700 by Marni Griffon I, RN Outcome: Progressing   Problem: Elimination: Goal: Will not experience complications related to bowel motility 02/19/2021 1704 by Marni Griffon I, RN Outcome: Progressing 02/19/2021 1700 by Marni Griffon I, RN Outcome: Progressing   Problem: Education: Goal: Knowledge of the prescribed therapeutic regimen will improve 02/19/2021 1704 by Marni Griffon I, RN Outcome: Progressing 02/19/2021 1700 by Marni Griffon I, RN Outcome: Progressing   Problem: Activity: Goal: Ability to avoid complications of mobility impairment will improve 02/19/2021 1704 by Marni Griffon I, RN Outcome: Progressing 02/19/2021 1700 by Marni Griffon I, RN Outcome: Progressing   Problem: Pain Management: Goal: Pain level will decrease with appropriate interventions 02/19/2021 1704 by Marni Griffon I, RN Outcome: Progressing 02/19/2021 1700 by Koren Shiver, RN Outcome: Progressing

## 2021-02-19 NOTE — Transfer of Care (Signed)
Immediate Anesthesia Transfer of Care Note  Patient: Teriana Danker  Procedure(s) Performed: LEFT TOTAL KNEE ARTHROPLASTY (Left Knee)  Patient Location: PACU  Anesthesia Type:Spinal  Level of Consciousness: sedated  Airway & Oxygen Therapy: Patient Spontanous Breathing and Patient connected to face mask oxygen  Post-op Assessment: Report given to RN and Post -op Vital signs reviewed and stable  Post vital signs: Reviewed and stable  Last Vitals:  Vitals Value Taken Time  BP 171/81 02/19/21 1422  Temp    Pulse 44 02/19/21 1424  Resp 14 02/19/21 1424  SpO2 98 % 02/19/21 1424  Vitals shown include unvalidated device data.  Last Pain:  Vitals:   02/19/21 1132  TempSrc:   PainSc: 5          Complications: No complications documented.

## 2021-02-19 NOTE — Op Note (Signed)
PREOP DIAGNOSIS: DJD LEFT KNEE POSTOP DIAGNOSIS:  same PROCEDURE: LEFT TKR ANESTHESIA: Spinal and MAC ATTENDING SURGEON: Hessie Dibble ASSISTANT: Loni Dolly PA  INDICATIONS FOR PROCEDURE: Brandy Miller is a 70 y.o. female who has struggled for a long time with pain due to degenerative arthritis of the left knee.  The patient has failed many conservative non-operative measures and at this point has pain which limits the ability to sleep and walk.  The patient is offered total knee replacement.  Informed operative consent was obtained after discussion of possible risks of anesthesia, infection, neurovascular injury, DVT, and death.  The importance of the post-operative rehabilitation protocol to optimize result was stressed extensively with the patient.  SUMMARY OF FINDINGS AND PROCEDURE:  Brandy Miller was taken to the operative suite where under the above anesthesia a left knee replacement was performed.  There were advanced degenerative changes and the bone quality was fair.  We used the DePuyAttune system and placed size 5 femur, 6 tibia, 35 mm all polyethylene patella, and a size 16 mm spacer.  Loni Dolly PA-C assisted throughout and was invaluable to the completion of the case in that he helped retract and maintain exposure while I placed the components.  He also helped close thereby minimizing OR time.  The patient was admitted for appropriate post-op care to include perioperative antibiotics and mechanical and pharmacologic measures for DVT prophylaxis.  DESCRIPTION OF PROCEDURE:  Brandy Miller was taken to the operative suite where the above anesthesia was applied.  The patient was positioned supine and prepped and draped in normal sterile fashion.  An appropriate time out was performed.  After the administration of kefzol pre-op antibiotic the leg was elevated and exsanguinated and a tourniquet inflated.  A standard longitudinal incision was made on the anterior knee.  Dissection was carried  down to the extensor mechanism.  All appropriate anti-infective measures were used including the pre-operative antibiotic, betadine impregnated drape, and closed hooded exhaust systems for each member of the surgical team.  A medial parapatellar incision was made in the extensor mechanism and the knee cap flipped and the knee flexed.  Some residual meniscal tissues were removed along with any remaining ACL/PCL tissue.  A guide was placed on the tibia and a flat cut was made on it's superior surface.  An intramedullary guide was placed in the femur and was utilized to make anterior and posterior cuts creating an appropriate flexion gap.  A second intramedullary guide was placed in the femur to make a distal cut properly balancing the knee with an extension gap equal to the flexion gap.  The three bones sized to the above mentioned sizes and the appropriate guides were placed and utilized.  A trial reduction was done and the knee easily came to full extension and the patella tracked well on flexion.  The trial components were removed and all bones were cleaned with pulsatile lavage and then dried thoroughly.  Cement was mixed and was pressurized onto the bones followed by placement of the aforementioned components.  Excess cement was trimmed and pressure was held on the components until the cement had hardened.  The tourniquet was deflated and a small amount of bleeding was controlled with cautery and pressure.  The knee was irrigated thoroughly.  The extensor mechanism was re-approximated with #1 ethibond in interrupted fashion.  The knee was flexed and the repair was solid.  The subcutaneous tissues were re-approximated with #0 and #2-0 vicryl and the skin closed with a  subcuticular stitch and steristrips.  A sterile dressing was applied.  Intraoperative fluids, EBL, and tourniquet time can be obtained from anesthesia records.  DISPOSITION:  The patient was taken to recovery room in stable condition and admitted  for appropriate post-op care to include peri-operative antibiotic and DVT prophylaxis with mechanical and pharmacologic measures.  Hessie Dibble 02/19/2021, 1:55 PM

## 2021-02-19 NOTE — Anesthesia Procedure Notes (Signed)
Procedure Name: MAC Date/Time: 02/19/2021 12:23 PM Performed by: Eben Burow, CRNA Pre-anesthesia Checklist: Patient identified, Emergency Drugs available, Suction available, Patient being monitored and Timeout performed Oxygen Delivery Method: Simple face mask Placement Confirmation: positive ETCO2

## 2021-02-19 NOTE — Anesthesia Procedure Notes (Signed)
Anesthesia Regional Block: Adductor canal block   Pre-Anesthetic Checklist: ,, timeout performed, Correct Patient, Correct Site, Correct Laterality, Correct Procedure, Correct Position, site marked, Risks and benefits discussed,  Surgical consent,  Pre-op evaluation,  At surgeon's request and post-op pain management  Laterality: Lower and Left  Prep: chloraprep       Needles:  Injection technique: Single-shot  Needle Type: Echogenic Needle     Needle Length: 9cm  Needle Gauge: 22     Additional Needles:   Procedures:,,,, ultrasound used (permanent image in chart),,,,  Narrative:  Start time: 02/19/2021 12:10 PM End time: 02/19/2021 12:15 PM Injection made incrementally with aspirations every 5 mL.  Performed by: Personally  Anesthesiologist: Trevor Iha, MD  Additional Notes: Block assessed prior to surgery. Pt tolerated procedure well.

## 2021-02-20 DIAGNOSIS — M1712 Unilateral primary osteoarthritis, left knee: Secondary | ICD-10-CM | POA: Diagnosis not present

## 2021-02-20 MED ORDER — HYDROCODONE-ACETAMINOPHEN 5-325 MG PO TABS: 1.0000 | ORAL_TABLET | Freq: Four times a day (QID) | ORAL | 0 refills | Status: DC | PRN

## 2021-02-20 MED ORDER — ASPIRIN EC 81 MG PO TBEC: 81.0000 mg | DELAYED_RELEASE_TABLET | Freq: Two times a day (BID) | ORAL | 0 refills | Status: AC

## 2021-02-20 MED ORDER — CHLORHEXIDINE GLUCONATE CLOTH 2 % EX PADS
6.0000 | MEDICATED_PAD | Freq: Every day | CUTANEOUS | Status: DC
  Administered 2021-02-20: 6 via TOPICAL

## 2021-02-20 MED ORDER — TIZANIDINE HCL 4 MG PO TABS: 4.0000 mg | ORAL_TABLET | Freq: Four times a day (QID) | ORAL | 1 refills | Status: AC | PRN

## 2021-02-20 NOTE — TOC Transition Note (Signed)
Transition of Care [facility]) - CM/SW Discharge Note   Patient Details  Name: Brandy Miller MRN: [id number] Date of Birth: 1951/09/28  [facility] CM/SW Contact:  Lennart Pall, LCSW Phone Number: 02/20/2021, 9:34 AM   Clinical Narrative:     Met briefly with pt and confirming need for rw - [employer] to deliver.  Plan for HHPT via [place].  No further TOC needs.  Final next level of care: [place] Barriers to Discharge: No Barriers Identified   Patient Goals and CMS Choice Patient states their goals for this hospitalization and ongoing recovery are:: return home      Discharge Placement                       Discharge Plan and Services                DME Arranged: [place] rolling DME Agency: [employer]     Representative spoke with at DME Agency: Ovid Curd HH Arranged: PT [place]: [facility] [employer] ((now [place] Date [place]:  (referral made prior to surgery)      Social Determinants of Health (SDOH) Interventions     Readmission Risk Interventions No flowsheet data found.

## 2021-02-20 NOTE — Discharge Summary (Deleted)
Patient ID: Brandy Miller MRN: [id number] DOB/AGE: [birth year] 70 y.o.  Admit date: 02/19/2021 Discharge date: 02/20/2021  Admission Diagnoses:  Principal Problem:   Primary osteoarthritis of left knee   Discharge Diagnoses:  Same  Past Medical History:  Diagnosis Date  . Afib (HCC)   . Anxiety   . Arthritis   . Asthma   . Chronic kidney disease   . Colon polyps   . Complication of anesthesia    Difficulty waking up after shoulder surgery  . COPD (chronic obstructive pulmonary disease) (HCC)    Pt denies, states just asthma  . COVID-19    2021  . Depression   . Dysrhythmia   . Endometrial polyp   . Female cystocele   . GERD (gastroesophageal reflux disease)   . H/O cardiac radiofrequency ablation    2019  . Headache   . Hypertension   . Hyperthyroidism   . ILD (interstitial lung disease) (HCC)   . Memory loss   . Mixed hyperlipidemia   . MVP (mitral valve prolapse)   . Pneumonia   . Pulmonary nodule   . Sleep apnea   . Vertigo   . Vitamin D deficiency     Surgeries: Procedure(s): LEFT TOTAL KNEE ARTHROPLASTY on 02/19/2021   Consultants:   Discharged Condition: Improved  Hospital Course: Brandy Miller is an 70 y.o. female who was admitted 02/19/2021 for operative treatment ofPrimary osteoarthritis of left knee. Patient has severe unremitting pain that affects sleep, daily activities, and work/hobbies. After pre-op clearance the patient was taken to the operating room on 02/19/2021 and underwent  Procedure(s): LEFT TOTAL KNEE ARTHROPLASTY.    Patient was given perioperative antibiotics:  Anti-infectives (From admission, onward)   Start     Dose/Rate Route Frequency Ordered Stop   02/19/21 1830  ceFAZolin (ANCEF) IVPB 2g/100 mL premix        2 g 200 mL/hr over 30 Minutes Intravenous Every 6 hours 02/19/21 1609 02/20/21 0100   02/19/21 1100  ceFAZolin (ANCEF) IVPB 2g/100 mL premix        2 g 200 mL/hr over 30 Minutes Intravenous On call to O.R. 02/19/21 1049  02/19/21 1236       Patient was given sequential compression devices, early ambulation, and chemoprophylaxis to prevent DVT.  Patient benefited maximally from hospital stay and there were no complications.    Recent vital signs:  Patient Vitals for the past 24 hrs:  BP Temp Temp src Pulse Resp SpO2 Height Weight  02/20/21 0445 (!) 164/70 97.8 F (36.6 C) -- (!) 45 16 99 % -- --  02/20/21 0049 (!) 145/68 97.6 F (36.4 C) -- (!) 53 16 97 % -- --  02/19/21 2115 (!) 149/66 98.3 F (36.8 C) Oral (!) 59 16 100 % -- --  02/19/21 1810 (!) 172/78 97.8 F (36.6 C) Oral 62 18 100 % -- --  02/19/21 1608 (!) 148/79 (!) 97.5 F (36.4 C) Axillary (!) 45 20 100 % 5\' 10"  (1.778 m) 103.9 kg  02/19/21 1545 139/75 (!) 97.5 F (36.4 C) -- (!) 55 19 100 % -- --  02/19/21 1530 135/64 -- -- (!) 45 12 100 % -- --  02/19/21 1515 140/69 (!) 97.4 F (36.3 C) -- (!) 50 11 100 % -- --  02/19/21 1500 (!) 147/74 (!) 97.2 F (36.2 C) -- (!) 44 14 100 % -- --  02/19/21 1445 (!) 143/65 -- -- (!) 45 13 100 % -- --  02/19/21 1430 (!) 168/78 -- -- 04/21/21  47 10 99 % -- --  02/19/21 1423 (!) 171/81 (!) 96.4 F (35.8 C) -- (!) 47 10 98 % -- --  02/19/21 1216 (!) 144/71 -- -- (!) 54 13 100 % -- --  02/19/21 1211 (!) 202/78 -- -- (!) 51 16 100 % -- --  02/19/21 1206 (!) 193/86 -- -- (!) 51 13 100 % -- --  02/19/21 1201 (!) 198/105 -- -- (!) 51 11 100 % -- --  02/19/21 1156 -- -- -- -- 16 -- -- --  02/19/21 1132 -- -- -- -- -- -- -- 103.9 kg  02/19/21 1116 (!) 175/80 98 F (36.7 C) Oral (!) 55 16 100 % -- --     Recent laboratory studies: No results for input(s): WBC, HGB, HCT, PLT, NA, K, CL, CO2, BUN, CREATININE, GLUCOSE, INR, CALCIUM in the last 72 hours.  Invalid input(s): PT, 2   Discharge Medications:   Allergies as of 02/20/2021   No Known Allergies     Medication List    TAKE these medications   acetaminophen 650 MG CR tablet Commonly known as: TYLENOL Take 1,300 mg by mouth every 8 (eight) hours as  needed for pain.   albuterol 108 (90 Base) MCG/ACT inhaler Commonly known as: VENTOLIN HFA Inhale 1 puff into the lungs every 6 (six) hours as needed for wheezing or shortness of breath.   amLODipine 5 MG tablet Commonly known as: NORVASC Take 5 mg by mouth daily.   aspirin EC 81 MG tablet Take 1 tablet (81 mg total) by mouth 2 (two) times daily after a meal. Swallow whole. What changed: when to take this   cholecalciferol 25 MCG (1000 UNIT) tablet Commonly known as: VITAMIN D3 Take 1,000 Units by mouth in the morning and at bedtime.   citalopram 20 MG tablet Commonly known as: CELEXA Take 20 mg by mouth daily.   HYDROcodone-acetaminophen 5-325 MG tablet Commonly known as: NORCO/VICODIN Take 1-2 tablets by mouth every 6 (six) hours as needed for moderate pain or severe pain (post op pain).   meclizine 25 MG tablet Commonly known as: ANTIVERT Take 25 mg by mouth 3 (three) times daily as needed for dizziness.   propylthiouracil 50 MG tablet Commonly known as: PTU Take 50 mg by mouth See admin instructions. Takes daily except Fridays.   rosuvastatin 5 MG tablet Commonly known as: CRESTOR Take 5 mg by mouth daily.   tiZANidine 4 MG tablet Commonly known as: Zanaflex Take 1 tablet (4 mg total) by mouth every 6 (six) hours as needed for muscle spasms.   traMADol 50 MG tablet Commonly known as: ULTRAM Take 50 mg by mouth every 8 (eight) hours as needed for moderate pain.   vitamin C 500 MG tablet Commonly known as: ASCORBIC ACID Take 500 mg by mouth daily.            Durable Medical Equipment  (From admission, onward)         Start     Ordered   02/19/21 1610  DME Walker rolling  Once       Question:  Patient needs a walker to treat with the following condition  Answer:  Primary osteoarthritis of left knee   02/19/21 1609   02/19/21 1610  DME 3 n 1  Once        02/19/21 1609   02/19/21 1610  DME Bedside commode  Once       Question:  Patient needs a bedside  commode to  treat with the following condition  Answer:  Primary osteoarthritis of left knee   02/19/21 1609          Diagnostic Studies: DG Chest 2 View  Result Date: 02/15/2021 CLINICAL DATA:  Preop left knee replacement EXAM: CHEST - 2 VIEW COMPARISON:  CT chest 06/02/2020 FINDINGS: Mild bilateral chronic interstitial lung disease. No focal consolidation. No pleural effusion or pneumothorax. Heart and mediastinal contours are unremarkable. No acute osseous abnormality. IMPRESSION: No active cardiopulmonary disease. Electronically Signed   By: Elige Ko   On: 02/15/2021 11:12    Disposition: Discharge disposition: 01-Home or Self Care       Discharge Instructions    Call MD / Call 911   Complete by: As directed    If you experience chest pain or shortness of breath, CALL 911 and be transported to the hospital emergency room.  If you develope a fever above 101 F, pus (white drainage) or increased drainage or redness at the wound, or calf pain, call your surgeon's office.   Constipation Prevention   Complete by: As directed    Drink plenty of fluids.  Prune juice may be helpful.  You may use a stool softener, such as Colace (over the counter) 100 mg twice a day.  Use MiraLax (over the counter) for constipation as needed.   Diet - low sodium heart healthy   Complete by: As directed    Discharge instructions   Complete by: As directed    INSTRUCTIONS AFTER JOINT REPLACEMENT   Remove items at home which could result in a fall. This includes throw rugs or furniture in walking pathways ICE to the affected joint every three hours while awake for 30 minutes at a time, for at least the first 3-5 days, and then as needed for pain and swelling.  Continue to use ice for pain and swelling. You may notice swelling that will progress down to the foot and ankle.  This is normal after surgery.  Elevate your leg when you are not up walking on it.   Continue to use the breathing machine you got in  the hospital (incentive spirometer) which will help keep your temperature down.  It is common for your temperature to cycle up and down following surgery, especially at night when you are not up moving around and exerting yourself.  The breathing machine keeps your lungs expanded and your temperature down.   DIET:  As you were doing prior to hospitalization, we recommend a well-balanced diet.  DRESSING / WOUND CARE / SHOWERING  You may shower 3 days after surgery, but keep the wounds dry during showering.  You may use an occlusive plastic wrap (Press'n Seal for example), NO SOAKING/SUBMERGING IN THE BATHTUB.  If the bandage gets wet, change with a clean dry gauze.  If the incision gets wet, pat the wound dry with a clean towel.  ACTIVITY  Increase activity slowly as tolerated, but follow the weight bearing instructions below.   No driving for 6 weeks or until further direction given by your physician.  You cannot drive while taking narcotics.  No lifting or carrying greater than 10 lbs. until further directed by your surgeon. Avoid periods of inactivity such as sitting longer than an hour when not asleep. This helps prevent blood clots.  You may return to work once you are authorized by your doctor.     WEIGHT BEARING   Weight bearing as tolerated with assist device (walker, cane, etc) as directed, use  it as long as suggested by your surgeon or therapist, typically at least 4-6 weeks.   EXERCISES  Results after joint replacement surgery are often greatly improved when you follow the exercise, range of motion and muscle strengthening exercises prescribed by your doctor. Safety measures are also important to protect the joint from further injury. Any time any of these exercises cause you to have increased pain or swelling, decrease what you are doing until you are comfortable again and then slowly increase them. If you have problems or questions, call your caregiver or physical therapist for  advice.   Rehabilitation is important following a joint replacement. After just a few days of immobilization, the muscles of the leg can become weakened and shrink (atrophy).  These exercises are designed to build up the tone and strength of the thigh and leg muscles and to improve motion. Often times heat used for twenty to thirty minutes before working out will loosen up your tissues and help with improving the range of motion but do not use heat for the first two weeks following surgery (sometimes heat can increase post-operative swelling).   These exercises can be done on a training (exercise) mat, on the floor, on a table or on a bed. Use whatever works the best and is most comfortable for you.    Use music or television while you are exercising so that the exercises are a pleasant break in your day. This will make your life better with the exercises acting as a break in your routine that you can look forward to.   Perform all exercises about fifteen times, three times per day or as directed.  You should exercise both the operative leg and the other leg as well.  Exercises include:   Quad Sets - Tighten up the muscle on the front of the thigh (Quad) and hold for 5-10 seconds.   Straight Leg Raises - With your knee straight (if you were given a brace, keep it on), lift the leg to 60 degrees, hold for 3 seconds, and slowly lower the leg.  Perform this exercise against resistance later as your leg gets stronger.  Leg Slides: Lying on your back, slowly slide your foot toward your buttocks, bending your knee up off the floor (only go as far as is comfortable). Then slowly slide your foot back down until your leg is flat on the floor again.  Angel Wings: Lying on your back spread your legs to the side as far apart as you can without causing discomfort.  Hamstring Strength:  Lying on your back, push your heel against the floor with your leg straight by tightening up the muscles of your buttocks.  Repeat,  but this time bend your knee to a comfortable angle, and push your heel against the floor.  You may put a pillow under the heel to make it more comfortable if necessary.   A rehabilitation program following joint replacement surgery can speed recovery and prevent re-injury in the future due to weakened muscles. Contact your doctor or a physical therapist for more information on knee rehabilitation.    CONSTIPATION  Constipation is defined medically as fewer than three stools per week and severe constipation as less than one stool per week.  Even if you have a regular bowel pattern at home, your normal regimen is likely to be disrupted due to multiple reasons following surgery.  Combination of anesthesia, postoperative narcotics, change in appetite and fluid intake all can affect your bowels.  YOU MUST use at least one of the following options; they are listed in order of increasing strength to get the job done.  They are all available over the counter, and you may need to use some, POSSIBLY even all of these options:    Drink plenty of fluids (prune juice may be helpful) and high fiber foods Colace 100 mg by mouth twice a day  Senokot for constipation as directed and as needed Dulcolax (bisacodyl), take with full glass of water  Miralax (polyethylene glycol) once or twice a day as needed.  If you have tried all these things and are unable to have a bowel movement in the first 3-4 days after surgery call either your surgeon or your primary doctor.    If you experience loose stools or diarrhea, hold the medications until you stool forms back up.  If your symptoms do not get better within 1 week or if they get worse, check with your doctor.  If you experience "the worst abdominal pain ever" or develop nausea or vomiting, please contact the office immediately for further recommendations for treatment.   ITCHING:  If you experience itching with your medications, try taking only a single pain pill,  or even half a pain pill at a time.  You can also use Benadryl over the counter for itching or also to help with sleep.   TED HOSE STOCKINGS:  Use stockings on both legs until for at least 2 weeks or as directed by physician office. They may be removed at night for sleeping.  MEDICATIONS:  See your medication summary on the "After Visit Summary" that nursing will review with you.  You may have some home medications which will be placed on hold until you complete the course of blood thinner medication.  It is important for you to complete the blood thinner medication as prescribed.  PRECAUTIONS:  If you experience chest pain or shortness of breath - call 911 immediately for transfer to the hospital emergency department.   If you develop a fever greater that 101 F, purulent drainage from wound, increased redness or drainage from wound, foul odor from the wound/dressing, or calf pain - CONTACT YOUR SURGEON.                                                   FOLLOW-UP APPOINTMENTS:  If you do not already have a post-op appointment, please call the office for an appointment to be seen by your surgeon.  Guidelines for how soon to be seen are listed in your "After Visit Summary", but are typically between 1-4 weeks after surgery.  OTHER INSTRUCTIONS:   Knee Replacement:  Do not place pillow under knee, focus on keeping the knee straight while resting. CPM instructions: 0-90 degrees, 2 hours in the morning, 2 hours in the afternoon, and 2 hours in the evening. Place foam block, curve side up under heel at all times except when in CPM or when walking.  DO NOT modify, tear, cut, or change the foam block in any way.  POST-OPERATIVE OPIOID TAPER INSTRUCTIONS: It is important to wean off of your opioid medication as soon as possible. If you do not need pain medication after your surgery it is ok to stop day one. Opioids include: Codeine, Hydrocodone(Norco, Vicodin), Oxycodone(Percocet, oxycontin) and  hydromorphone amongst others.  Long term and  even short term use of opiods can cause: Increased pain response Dependence Constipation Depression Respiratory depression And more.  Withdrawal symptoms can include Flu like symptoms Nausea, vomiting And more Techniques to manage these symptoms Hydrate well Eat regular healthy meals Stay active Use relaxation techniques(deep breathing, meditating, yoga) Do Not substitute Alcohol to help with tapering If you have been on opioids for less than two weeks and do not have pain than it is ok to stop all together.  Plan to wean off of opioids This plan should start within one week post op of your joint replacement. Maintain the same interval or time between taking each dose and first decrease the dose.  Cut the total daily intake of opioids by one tablet each day Next start to increase the time between doses. The last dose that should be eliminated is the evening dose.     MAKE SURE YOU:  Understand these instructions.  Get help right away if you are not doing well or get worse.    Thank you for letting us be a part of your medical care team.  It is a privilege we respect greatly.  We hope these instructions will help you stay on track for a fast and full recovery!   Increase activity slowly as tolerated   Complete by: As directed    Post-operative opioid taper instructions:   Complete by: As directed    POST-OPERATIVE OPIOID TAPER INSTRUCTIONS: It is important to wean off of your opioid medication as soon as possible. If you do not need pain medication after your surgery it is ok to stop day one. Opioids include: Codeine, Hydrocodone(Norco, Vicodin), Oxycodone(Percocet, oxycontin) and hydromorphone amongst others.  Long term and even short term use of opiods can cause: Increased pain response Dependence Constipation Depression Respiratory depression And more.  Withdrawal symptoms can include Flu like symptoms Nausea,  vomiting And more Techniques to manage these symptoms Hydrate well Eat regular healthy meals Stay active Use relaxation techniques(deep breathing, meditating, yoga) Do Not substitute Alcohol to help with tapering If you have been on opioids for less than two weeks and do not have pain than it is ok to stop all together.  Plan to wean off of opioids This plan should start within one week post op of your joint replacement. Maintain the same interval or time between taking each dose and first decrease the dose.  Cut the total daily intake of opioids by one tablet each day Next start to increase the time between doses. The last dose that should be eliminated is the evening dose.          Follow-up Information    Marcene Corning, MD. Schedule an appointment as soon as possible for a visit in 2 weeks.   Specialty: Orthopedic Surgery Contact information: [address] [place] [address] [phone]                Signed: Ginger Organ Jenean Escandon 02/20/2021, 8:06 AM

## 2021-02-20 NOTE — Progress Notes (Signed)
Pt did well with therapy on first session but is concerned about going home today. States she does not feel comfortable because she will be alone. Thinks she needs another night with therapy today and tomorrow. Notified Elodia Florence, Ok for pt to stay tonight.

## 2021-02-20 NOTE — Progress Notes (Signed)
Subjective: 1 Day Post-Op Procedure(s) (LRB): LEFT TOTAL KNEE ARTHROPLASTY (Left)   Patient is resting comfortably. She is hoping to go home today.  Activity level:  wbat Diet tolerance:  ok Voiding:  ok Patient reports pain as mild.    Objective: Vital signs in last 24 hours: Temp:  [96.4 F (35.8 C)-98.3 F (36.8 C)] 97.8 F (36.6 C) (05/11 0445) Pulse Rate:  [44-62] 45 (05/11 0445) Resp:  [10-20] 16 (05/11 0445) BP: (135-202)/(64-105) 164/70 (05/11 0445) SpO2:  [97 %-100 %] 99 % (05/11 0445) Weight:  [103.9 kg] 103.9 kg (05/10 1608)  Labs: No results for input(s): HGB in the last 72 hours. No results for input(s): WBC, RBC, HCT, PLT in the last 72 hours. No results for input(s): NA, K, CL, CO2, BUN, CREATININE, GLUCOSE, CALCIUM in the last 72 hours. No results for input(s): LABPT, INR in the last 72 hours.  Physical Exam:  Neurologically intact ABD soft Neurovascular intact Sensation intact distally Intact pulses distally Dorsiflexion/Plantar flexion intact Incision: dressing C/D/I and no drainage No cellulitis present Compartment soft  Assessment/Plan:  1 Day Post-Op Procedure(s) (LRB): LEFT TOTAL KNEE ARTHROPLASTY (Left) Advance diet Up with therapy D/C IV fluids Discharge home with home health today if cleared and doing well. Follow up in office 2 weeks post op. Continue on 81mg  asa BID for DVT prevention.  Mannie Wineland 02/20/2021, 8:01 AM

## 2021-02-20 NOTE — Evaluation (Signed)
Physical Therapy Evaluation Patient Details Name: Brandy Miller MRN: [id number] DOB: May 31, 1951 Today's Date: 02/20/2021   History of Present Illness  Pt is a 70 year old female s/p Left TKA on 02/19/21  Clinical Impression  Pt is s/p TKA resulting in the deficits listed below (see PT Problem List).  Pt will benefit from skilled PT to increase their independence and safety with mobility to allow discharge to the venue listed below.  Pt assisted with ambulating in hallway and performing LEs exercises.  Pt pleasant and motivated.  Pt anticipates d/c home with HHPT.     Follow Up Recommendations Follow surgeon's recommendation for DC plan and follow-up therapies    Equipment Recommendations  Rolling walker with 5" wheels    Recommendations for Other Services       Precautions / Restrictions Precautions Precautions: Fall;Knee Restrictions Weight Bearing Restrictions: No      Mobility  Bed Mobility Overal bed mobility: Needs Assistance Bed Mobility: Supine to Sit     Supine to sit: Min guard     General bed mobility comments: verbal cues for sequence, pt self assisted left LE with UEs    Transfers Overall transfer level: Needs assistance Equipment used: Rolling walker (2 wheeled) Transfers: Sit to/from Stand Sit to Stand: Min guard         General transfer comment: verbal cues for UE and LE positioning  Ambulation/Gait Ambulation/Gait assistance: Min guard Gait Distance (Feet): 200 Feet Assistive device: Rolling walker (2 wheeled) Gait Pattern/deviations: Step-to pattern;Decreased stance time - left;Antalgic     General Gait Details: verbal cues for sequence, RW positioning, step length, posture  Stairs            Wheelchair Mobility    Modified Rankin (Stroke Patients Only)       Balance                                             Pertinent Vitals/Pain Pain Assessment: 0-10 Pain Score: 6  Pain Location: left knee Pain  Descriptors / Indicators: Aching;Sore Pain Intervention(s): Repositioned;Monitored during session;Ice applied    Home Living Family/patient expects to be discharged to:: Private residence Living Arrangements: Alone   Type of Home: House Home Access: Stairs to enter Entrance Stairs-Rails: Right;Left;Can reach both Secretary/administrator of Steps: 3 Home Layout: One level Home Equipment: None      Prior Function Level of Independence: Independent               Hand Dominance        Extremity/Trunk Assessment        Lower Extremity Assessment Lower Extremity Assessment: LLE deficits/detail LLE Deficits / Details: left knee AAROM -8-30* limited by pain       Communication   Communication: No difficulties  Cognition Arousal/Alertness: Awake/alert Behavior During Therapy: WFL for tasks assessed/performed Overall Cognitive Status: Within Functional Limits for tasks assessed                                        General Comments      Exercises Total Joint Exercises Ankle Circles/Pumps: AROM;Both;10 reps Quad Sets: AROM;Both;10 reps Heel Slides: AAROM;Left;10 reps Hip ABduction/ADduction: AAROM;Left;10 reps Straight Leg Raises: AAROM;Left;10 reps   Assessment/Plan    PT Assessment Patient needs continued PT services  PT Problem List Decreased strength;Decreased mobility;Decreased range of motion;Decreased knowledge of use of DME;Pain;Decreased knowledge of precautions       PT Treatment Interventions Stair training;Gait training;DME instruction;Therapeutic exercise;Balance training;Functional mobility training;Therapeutic activities;Patient/family education    PT Goals (Current goals can be found in the Care Plan section)  Acute Rehab PT Goals PT Goal Formulation: With patient Time For Goal Achievement: 02/27/21 Potential to Achieve Goals: Good    Frequency 7X/week   Barriers to discharge        Co-evaluation                AM-PAC PT "6 Clicks" Mobility  Outcome Measure Help needed turning from your back to your side while in a flat bed without using bedrails?: A Little Help needed moving from lying on your back to sitting on the side of a flat bed without using bedrails?: A Little Help needed moving to and from a bed to a chair (including a wheelchair)?: A Little Help needed standing up from a chair using your arms (e.g., wheelchair or bedside chair)?: A Little Help needed to walk in hospital room?: A Little Help needed climbing 3-5 steps with a railing? : A Lot 6 Click Score: 17    End of Session Equipment Utilized During Treatment: Gait belt Activity Tolerance: Patient tolerated treatment well Patient left: in chair;with call bell/phone within reach;with chair alarm set Nurse Communication: Mobility status PT Visit Diagnosis: Other abnormalities of gait and mobility (R26.89)    Time: 1194-1740 PT Time Calculation (min) (ACUTE ONLY): 21 min   Charges:   PT Evaluation $PT Eval Low Complexity: 1 Low         Kati PT, DPT Acute Rehabilitation Services Pager: [phone] Office: [phone]  Maida Sale E 02/20/2021, 2:41 PM

## 2021-02-20 NOTE — Progress Notes (Signed)
Physical Therapy Treatment Patient Details Name: Brandy Miller MRN: [id number] DOB: 10-15-1950 Today's Date: 02/20/2021    History of Present Illness Pt is a 70 year old female s/p Left TKA on 02/19/21    PT Comments    Pt ambulated in hallway and practiced safe stair technique.  Pt anticipates staying overnight and d/c home tomorrow (per pt and RN).    Follow Up Recommendations  Follow surgeon's recommendation for DC plan and follow-up therapies     Equipment Recommendations  Rolling walker with 5" wheels    Recommendations for Other Services       Precautions / Restrictions Precautions Precautions: Fall;Knee Restrictions Weight Bearing Restrictions: No    Mobility  Bed Mobility Overal bed mobility: Needs Assistance Bed Mobility: Supine to Sit;Sit to Supine     Supine to sit: Supervision Sit to supine: Supervision   General bed mobility comments: verbal cues for self assist utilizing gait belt    Transfers Overall transfer level: Needs assistance Equipment used: Rolling walker (2 wheeled) Transfers: Sit to/from Stand Sit to Stand: Min guard         General transfer comment: verbal cues for UE and LE positioning  Ambulation/Gait Ambulation/Gait assistance: Min guard Gait Distance (Feet): 200 Feet Assistive device: Rolling walker (2 wheeled) Gait Pattern/deviations: Step-to pattern;Decreased stance time - left;Antalgic     General Gait Details: verbal cues for sequence, RW positioning, step length, posture   Stairs Stairs: Yes Stairs assistance: Min guard Stair Management: Step to pattern;Forwards;Two rails Number of Stairs: 4 General stair comments: verbal cues for sequence, safety   Wheelchair Mobility    Modified Rankin (Stroke Patients Only)       Balance                                            Cognition Arousal/Alertness: Awake/alert Behavior During Therapy: WFL for tasks assessed/performed Overall Cognitive  Status: Within Functional Limits for tasks assessed                                        Exercises Total Joint Exercises Ankle Circles/Pumps: AROM;Both;10 reps Quad Sets: AROM;Both;10 reps Heel Slides: AAROM;Left;10 reps Hip ABduction/ADduction: AAROM;Left;10 reps Straight Leg Raises: AAROM;Left;10 reps    General Comments        Pertinent Vitals/Pain Pain Assessment: 0-10 Pain Score: 5  Pain Location: left knee Pain Descriptors / Indicators: Aching;Sore Pain Intervention(s): Repositioned;Monitored during session    Home Living Family/patient expects to be discharged to:: Private residence Living Arrangements: Alone   Type of Home: House Home Access: Stairs to enter Entrance Stairs-Rails: Right;Left;Can reach both Home Layout: One level Home Equipment: None      Prior Function Level of Independence: Independent          PT Goals (current goals can now be found in the care plan section) Acute Rehab PT Goals PT Goal Formulation: With patient Time For Goal Achievement: 02/27/21 Potential to Achieve Goals: Good Progress towards PT goals: Progressing toward goals    Frequency    7X/week      PT Plan Current plan remains appropriate    Co-evaluation              AM-PAC PT "6 Clicks" Mobility   Outcome Measure  Help needed turning from  your back to your side while in a flat bed without using bedrails?: A Little Help needed moving from lying on your back to sitting on the side of a flat bed without using bedrails?: A Little Help needed moving to and from a bed to a chair (including a wheelchair)?: A Little Help needed standing up from a chair using your arms (e.g., wheelchair or bedside chair)?: A Little Help needed to walk in hospital room?: A Little Help needed climbing 3-5 steps with a railing? : A Little 6 Click Score: 18    End of Session Equipment Utilized During Treatment: Gait belt Activity Tolerance: Patient tolerated  treatment well Patient left: with call bell/phone within reach;in bed;with bed alarm set Nurse Communication: Mobility status PT Visit Diagnosis: Other abnormalities of gait and mobility (R26.89)     Time: 7654-6503 PT Time Calculation (min) (ACUTE ONLY): 19 min  Charges:  $Gait Training: 8-22 mins                     Paulino Door, DPT Acute Rehabilitation Services Pager: [phone] Office: [phone]  Maida Sale E 02/20/2021, 4:37 PM

## 2021-02-21 ENCOUNTER — Encounter (HOSPITAL_COMMUNITY): Payer: Self-pay | Admitting: Orthopaedic Surgery

## 2021-02-21 DIAGNOSIS — M1712 Unilateral primary osteoarthritis, left knee: Secondary | ICD-10-CM | POA: Diagnosis not present

## 2021-02-21 MED ORDER — OXYCODONE-ACETAMINOPHEN 5-325 MG PO TABS
1.0000 | ORAL_TABLET | Freq: Four times a day (QID) | ORAL | Status: DC | PRN
  Administered 2021-02-21: 2 via ORAL
  Filled 2021-02-21 (×2): qty 2

## 2021-02-21 MED ORDER — KETOROLAC TROMETHAMINE 15 MG/ML IJ SOLN
7.5000 mg | Freq: Four times a day (QID) | INTRAMUSCULAR | Status: DC
  Administered 2021-02-21 (×2): 7.5 mg via INTRAVENOUS
  Filled 2021-02-21 (×2): qty 1

## 2021-02-21 MED ORDER — OXYCODONE-ACETAMINOPHEN 5-325 MG PO TABS: 1.0000 | ORAL_TABLET | Freq: Four times a day (QID) | ORAL | 0 refills | Status: AC | PRN

## 2021-02-21 NOTE — Progress Notes (Signed)
Subjective: 2 Days Post-Op Procedure(s) (LRB): LEFT TOTAL KNEE ARTHROPLASTY (Left)   Patinet did well with PT yesterday but has pain this morning. She would still liek to go home today if pain under better control.  Activity level:  wbat Diet tolerance:  ok Voiding:  ok Patient reports pain as moderate.    Objective: Vital signs in last 24 hours: Temp:  [98 F (36.7 C)-98.5 F (36.9 C)] 98 F (36.7 C) (05/12 0529) Pulse Rate:  [52-62] 62 (05/12 0529) Resp:  [16-18] 18 (05/12 0529) BP: (131-170)/(57-71) 170/57 (05/12 0529) SpO2:  [99 %-100 %] 99 % (05/12 0529)  Labs: No results for input(s): HGB in the last 72 hours. No results for input(s): WBC, RBC, HCT, PLT in the last 72 hours. No results for input(s): NA, K, CL, CO2, BUN, CREATININE, GLUCOSE, CALCIUM in the last 72 hours. No results for input(s): LABPT, INR in the last 72 hours.  Physical Exam:  Neurologically intact ABD soft Neurovascular intact Sensation intact distally Intact pulses distally Dorsiflexion/Plantar flexion intact Incision: dressing C/D/I and no drainage No cellulitis present Compartment soft  Assessment/Plan:  2 Days Post-Op Procedure(s) (LRB): LEFT TOTAL KNEE ARTHROPLASTY (Left) Advance diet Up with therapy Discharge home with home health today after PT if cleared and pain under control. I will switch her to percocet for pain control and will order IV toradol once again as that helped well with pain yesterday.  Follow up in office 2 weeks post op. Continue on 81mg  asa BID for post op pain control   Mischell Branford 02/21/2021, 6:28 AM

## 2021-02-21 NOTE — Discharge Summary (Signed)
Patient ID: Brandy Miller MRN: [id number] DOB/AGE: Feb 28, 1951 70 y.o.  Admit date: 02/19/2021 Discharge date: 02/21/2021  Admission Diagnoses:  Principal Problem:   Primary osteoarthritis of left knee   Discharge Diagnoses:  Same  Past Medical History:  Diagnosis Date  . Afib (HCC)   . Anxiety   . Arthritis   . Asthma   . Chronic kidney disease   . Colon polyps   . Complication of anesthesia    Difficulty waking up after shoulder surgery  . COPD (chronic obstructive pulmonary disease) (HCC)    Pt denies, states just asthma  . COVID-19    2021  . Depression   . Dysrhythmia   . Endometrial polyp   . Female cystocele   . GERD (gastroesophageal reflux disease)   . H/O cardiac radiofrequency ablation    2019  . Headache   . Hypertension   . Hyperthyroidism   . ILD (interstitial lung disease) (HCC)   . Memory loss   . Mixed hyperlipidemia   . MVP (mitral valve prolapse)   . Pneumonia   . Pulmonary nodule   . Sleep apnea   . Vertigo   . Vitamin D deficiency     Surgeries: Procedure(s): LEFT TOTAL KNEE ARTHROPLASTY on 02/19/2021   Consultants:   Discharged Condition: Improved  Hospital Course: Brandy Miller is an 70 y.o. female who was admitted 02/19/2021 for operative treatment ofPrimary osteoarthritis of left knee. Patient has severe unremitting pain that affects sleep, daily activities, and work/hobbies. After pre-op clearance the patient was taken to the operating room on 02/19/2021 and underwent  Procedure(s): LEFT TOTAL KNEE ARTHROPLASTY.    Patient was given perioperative antibiotics:  Anti-infectives (From admission, onward)   Start     Dose/Rate Route Frequency Ordered Stop   02/19/21 1830  ceFAZolin (ANCEF) IVPB 2g/100 mL premix        2 g 200 mL/hr over 30 Minutes Intravenous Every 6 hours 02/19/21 1609 02/20/21 0100   02/19/21 1100  ceFAZolin (ANCEF) IVPB 2g/100 mL premix        2 g 200 mL/hr over 30 Minutes Intravenous On call to O.R. 02/19/21 1049  02/19/21 1236       Patient was given sequential compression devices, early ambulation, and chemoprophylaxis to prevent DVT.  Patient benefited maximally from hospital stay and there were no complications.    Recent vital signs:  Patient Vitals for the past 24 hrs:  BP Temp Temp src Pulse Resp SpO2  02/21/21 0529 (!) 170/57 98 F (36.7 C) Oral 62 18 99 %  02/20/21 2129 131/63 98.4 F (36.9 C) Oral (!) 55 16 100 %  02/20/21 1333 (!) 146/71 98.5 F (36.9 C) -- (!) 52 18 100 %  02/20/21 1013 (!) 145/68 98.4 F (36.9 C) -- (!) 53 18 100 %     Recent laboratory studies: No results for input(s): WBC, HGB, HCT, PLT, NA, K, CL, CO2, BUN, CREATININE, GLUCOSE, INR, CALCIUM in the last 72 hours.  Invalid input(s): PT, 2   Discharge Medications:   Allergies as of 02/21/2021   No Known Allergies     Medication List    TAKE these medications   acetaminophen 650 MG CR tablet Commonly known as: TYLENOL Take 1,300 mg by mouth every 8 (eight) hours as needed for pain.   albuterol 108 (90 Base) MCG/ACT inhaler Commonly known as: VENTOLIN HFA Inhale 1 puff into the lungs every 6 (six) hours as needed for wheezing or shortness of breath.  amLODipine 5 MG tablet Commonly known as: NORVASC Take 5 mg by mouth daily.   aspirin EC 81 MG tablet Take 1 tablet (81 mg total) by mouth 2 (two) times daily after a meal. Swallow whole. What changed: when to take this   cholecalciferol 25 MCG (1000 UNIT) tablet Commonly known as: VITAMIN D3 Take 1,000 Units by mouth in the morning and at bedtime.   citalopram 20 MG tablet Commonly known as: CELEXA Take 20 mg by mouth daily.   meclizine 25 MG tablet Commonly known as: ANTIVERT Take 25 mg by mouth 3 (three) times daily as needed for dizziness.   oxyCODONE-acetaminophen 5-325 MG tablet Commonly known as: PERCOCET/ROXICET Take 1-2 tablets by mouth every 6 (six) hours as needed for moderate pain or severe pain (post op pain).    propylthiouracil 50 MG tablet Commonly known as: PTU Take 50 mg by mouth See admin instructions. Takes daily except Fridays.   rosuvastatin 5 MG tablet Commonly known as: CRESTOR Take 5 mg by mouth daily.   tiZANidine 4 MG tablet Commonly known as: Zanaflex Take 1 tablet (4 mg total) by mouth every 6 (six) hours as needed for muscle spasms.   traMADol 50 MG tablet Commonly known as: ULTRAM Take 50 mg by mouth every 8 (eight) hours as needed for moderate pain.   vitamin C 500 MG tablet Commonly known as: ASCORBIC ACID Take 500 mg by mouth daily.            Durable Medical Equipment  (From admission, onward)         Start     Ordered   02/19/21 1610  DME Walker rolling  Once       Question:  Patient needs a walker to treat with the following condition  Answer:  Primary osteoarthritis of left knee   02/19/21 1609   02/19/21 1610  DME 3 n 1  Once        02/19/21 1609   02/19/21 1610  DME Bedside commode  Once       Question:  Patient needs a bedside commode to treat with the following condition  Answer:  Primary osteoarthritis of left knee   02/19/21 1609          Diagnostic Studies: DG Chest 2 View  Result Date: 02/15/2021 CLINICAL DATA:  Preop left knee replacement EXAM: CHEST - 2 VIEW COMPARISON:  CT chest 06/02/2020 FINDINGS: Mild bilateral chronic interstitial lung disease. No focal consolidation. No pleural effusion or pneumothorax. Heart and mediastinal contours are unremarkable. No acute osseous abnormality. IMPRESSION: No active cardiopulmonary disease. Electronically Signed   By: Elige Ko   On: 02/15/2021 11:12    Disposition: Discharge disposition: 01-Home or Self Care       Discharge Instructions    Call MD / Call 911   Complete by: As directed    If you experience chest pain or shortness of breath, CALL 911 and be transported to the hospital emergency room.  If you develope a fever above 101 F, pus (white drainage) or increased drainage or  redness at the wound, or calf pain, call your surgeon's office.   Call MD / Call 911   Complete by: As directed    If you experience chest pain or shortness of breath, CALL 911 and be transported to the hospital emergency room.  If you develope a fever above 101 F, pus (white drainage) or increased drainage or redness at the wound, or calf pain, call your surgeon's  office.   Constipation Prevention   Complete by: As directed    Drink plenty of fluids.  Prune juice may be helpful.  You may use a stool softener, such as Colace (over the counter) 100 mg twice a day.  Use MiraLax (over the counter) for constipation as needed.   Constipation Prevention   Complete by: As directed    Drink plenty of fluids.  Prune juice may be helpful.  You may use a stool softener, such as Colace (over the counter) 100 mg twice a day.  Use MiraLax (over the counter) for constipation as needed.   Diet - low sodium heart healthy   Complete by: As directed    Diet - low sodium heart healthy   Complete by: As directed    Discharge instructions   Complete by: As directed    INSTRUCTIONS AFTER JOINT REPLACEMENT   Remove items at home which could result in a fall. This includes throw rugs or furniture in walking pathways ICE to the affected joint every three hours while awake for 30 minutes at a time, for at least the first 3-5 days, and then as needed for pain and swelling.  Continue to use ice for pain and swelling. You may notice swelling that will progress down to the foot and ankle.  This is normal after surgery.  Elevate your leg when you are not up walking on it.   Continue to use the breathing machine you got in the hospital (incentive spirometer) which will help keep your temperature down.  It is common for your temperature to cycle up and down following surgery, especially at night when you are not up moving around and exerting yourself.  The breathing machine keeps your lungs expanded and your temperature  down.   DIET:  As you were doing prior to hospitalization, we recommend a well-balanced diet.  DRESSING / WOUND CARE / SHOWERING  You may shower 3 days after surgery, but keep the wounds dry during showering.  You may use an occlusive plastic wrap (Press'n Seal for example), NO SOAKING/SUBMERGING IN THE BATHTUB.  If the bandage gets wet, change with a clean dry gauze.  If the incision gets wet, pat the wound dry with a clean towel.  ACTIVITY  Increase activity slowly as tolerated, but follow the weight bearing instructions below.   No driving for 6 weeks or until further direction given by your physician.  You cannot drive while taking narcotics.  No lifting or carrying greater than 10 lbs. until further directed by your surgeon. Avoid periods of inactivity such as sitting longer than an hour when not asleep. This helps prevent blood clots.  You may return to work once you are authorized by your doctor.     WEIGHT BEARING   Weight bearing as tolerated with assist device (walker, cane, etc) as directed, use it as long as suggested by your surgeon or therapist, typically at least 4-6 weeks.   EXERCISES  Results after joint replacement surgery are often greatly improved when you follow the exercise, range of motion and muscle strengthening exercises prescribed by your doctor. Safety measures are also important to protect the joint from further injury. Any time any of these exercises cause you to have increased pain or swelling, decrease what you are doing until you are comfortable again and then slowly increase them. If you have problems or questions, call your caregiver or physical therapist for advice.   Rehabilitation is important following a joint replacement. After just  a few days of immobilization, the muscles of the leg can become weakened and shrink (atrophy).  These exercises are designed to build up the tone and strength of the thigh and leg muscles and to improve motion. Often  times heat used for twenty to thirty minutes before working out will loosen up your tissues and help with improving the range of motion but do not use heat for the first two weeks following surgery (sometimes heat can increase post-operative swelling).   These exercises can be done on a training (exercise) mat, on the floor, on a table or on a bed. Use whatever works the best and is most comfortable for you.    Use music or television while you are exercising so that the exercises are a pleasant break in your day. This will make your life better with the exercises acting as a break in your routine that you can look forward to.   Perform all exercises about fifteen times, three times per day or as directed.  You should exercise both the operative leg and the other leg as well.  Exercises include:   Quad Sets - Tighten up the muscle on the front of the thigh (Quad) and hold for 5-10 seconds.   Straight Leg Raises - With your knee straight (if you were given a brace, keep it on), lift the leg to 60 degrees, hold for 3 seconds, and slowly lower the leg.  Perform this exercise against resistance later as your leg gets stronger.  Leg Slides: Lying on your back, slowly slide your foot toward your buttocks, bending your knee up off the floor (only go as far as is comfortable). Then slowly slide your foot back down until your leg is flat on the floor again.  Angel Wings: Lying on your back spread your legs to the side as far apart as you can without causing discomfort.  Hamstring Strength:  Lying on your back, push your heel against the floor with your leg straight by tightening up the muscles of your buttocks.  Repeat, but this time bend your knee to a comfortable angle, and push your heel against the floor.  You may put a pillow under the heel to make it more comfortable if necessary.   A rehabilitation program following joint replacement surgery can speed recovery and prevent re-injury in the future due to  weakened muscles. Contact your doctor or a physical therapist for more information on knee rehabilitation.    CONSTIPATION  Constipation is defined medically as fewer than three stools per week and severe constipation as less than one stool per week.  Even if you have a regular bowel pattern at home, your normal regimen is likely to be disrupted due to multiple reasons following surgery.  Combination of anesthesia, postoperative narcotics, change in appetite and fluid intake all can affect your bowels.   YOU MUST use at least one of the following options; they are listed in order of increasing strength to get the job done.  They are all available over the counter, and you may need to use some, POSSIBLY even all of these options:    Drink plenty of fluids (prune juice may be helpful) and high fiber foods Colace 100 mg by mouth twice a day  Senokot for constipation as directed and as needed Dulcolax (bisacodyl), take with full glass of water  Miralax (polyethylene glycol) once or twice a day as needed.  If you have tried all these things and are unable to have  a bowel movement in the first 3-4 days after surgery call either your surgeon or your primary doctor.    If you experience loose stools or diarrhea, hold the medications until you stool forms back up.  If your symptoms do not get better within 1 week or if they get worse, check with your doctor.  If you experience "the worst abdominal pain ever" or develop nausea or vomiting, please contact the office immediately for further recommendations for treatment.   ITCHING:  If you experience itching with your medications, try taking only a single pain pill, or even half a pain pill at a time.  You can also use Benadryl over the counter for itching or also to help with sleep.   TED HOSE STOCKINGS:  Use stockings on both legs until for at least 2 weeks or as directed by physician office. They may be removed at night for sleeping.  MEDICATIONS:  See  your medication summary on the "After Visit Summary" that nursing will review with you.  You may have some home medications which will be placed on hold until you complete the course of blood thinner medication.  It is important for you to complete the blood thinner medication as prescribed.  PRECAUTIONS:  If you experience chest pain or shortness of breath - call 911 immediately for transfer to the hospital emergency department.   If you develop a fever greater that 101 F, purulent drainage from wound, increased redness or drainage from wound, foul odor from the wound/dressing, or calf pain - CONTACT YOUR SURGEON.                                                   FOLLOW-UP APPOINTMENTS:  If you do not already have a post-op appointment, please call the office for an appointment to be seen by your surgeon.  Guidelines for how soon to be seen are listed in your "After Visit Summary", but are typically between 1-4 weeks after surgery.  OTHER INSTRUCTIONS:   Knee Replacement:  Do not place pillow under knee, focus on keeping the knee straight while resting. CPM instructions: 0-90 degrees, 2 hours in the morning, 2 hours in the afternoon, and 2 hours in the evening. Place foam block, curve side up under heel at all times except when in CPM or when walking.  DO NOT modify, tear, cut, or change the foam block in any way.  POST-OPERATIVE OPIOID TAPER INSTRUCTIONS: It is important to wean off of your opioid medication as soon as possible. If you do not need pain medication after your surgery it is ok to stop day one. Opioids include: Codeine, Hydrocodone(Norco, Vicodin), Oxycodone(Percocet, oxycontin) and hydromorphone amongst others.  Long term and even short term use of opiods can cause: Increased pain response Dependence Constipation Depression Respiratory depression And more.  Withdrawal symptoms can include Flu like symptoms Nausea, vomiting And more Techniques to manage these  symptoms Hydrate well Eat regular healthy meals Stay active Use relaxation techniques(deep breathing, meditating, yoga) Do Not substitute Alcohol to help with tapering If you have been on opioids for less than two weeks and do not have pain than it is ok to stop all together.  Plan to wean off of opioids This plan should start within one week post op of your joint replacement. Maintain the same interval or time between taking  each dose and first decrease the dose.  Cut the total daily intake of opioids by one tablet each day Next start to increase the time between doses. The last dose that should be eliminated is the evening dose.     MAKE SURE YOU:  Understand these instructions.  Get help right away if you are not doing well or get worse.    Thank you for letting us be a part of your medical care team.  It is a privilege we respect greatly.  We hope these instructions will help you stay on track for a fast and full recovery!   Discharge instructions   Complete by: As directed    INSTRUCTIONS AFTER JOINT REPLACEMENT   Remove items at home which could result in a fall. This includes throw rugs or furniture in walking pathways ICE to the affected joint every three hours while awake for 30 minutes at a time, for at least the first 3-5 days, and then as needed for pain and swelling.  Continue to use ice for pain and swelling. You may notice swelling that will progress down to the foot and ankle.  This is normal after surgery.  Elevate your leg when you are not up walking on it.   Continue to use the breathing machine you got in the hospital (incentive spirometer) which will help keep your temperature down.  It is common for your temperature to cycle up and down following surgery, especially at night when you are not up moving around and exerting yourself.  The breathing machine keeps your lungs expanded and your temperature down.   DIET:  As you were doing prior to hospitalization, we  recommend a well-balanced diet.  DRESSING / WOUND CARE / SHOWERING  You may shower 3 days after surgery, but keep the wounds dry during showering.  You may use an occlusive plastic wrap (Press'n Seal for example), NO SOAKING/SUBMERGING IN THE BATHTUB.  If the bandage gets wet, change with a clean dry gauze.  If the incision gets wet, pat the wound dry with a clean towel.  ACTIVITY  Increase activity slowly as tolerated, but follow the weight bearing instructions below.   No driving for 6 weeks or until further direction given by your physician.  You cannot drive while taking narcotics.  No lifting or carrying greater than 10 lbs. until further directed by your surgeon. Avoid periods of inactivity such as sitting longer than an hour when not asleep. This helps prevent blood clots.  You may return to work once you are authorized by your doctor.     WEIGHT BEARING   Weight bearing as tolerated with assist device (walker, cane, etc) as directed, use it as long as suggested by your surgeon or therapist, typically at least 4-6 weeks.   EXERCISES  Results after joint replacement surgery are often greatly improved when you follow the exercise, range of motion and muscle strengthening exercises prescribed by your doctor. Safety measures are also important to protect the joint from further injury. Any time any of these exercises cause you to have increased pain or swelling, decrease what you are doing until you are comfortable again and then slowly increase them. If you have problems or questions, call your caregiver or physical therapist for advice.   Rehabilitation is important following a joint replacement. After just a few days of immobilization, the muscles of the leg can become weakened and shrink (atrophy).  These exercises are designed to build up the tone and strength  of the thigh and leg muscles and to improve motion. Often times heat used for twenty to thirty minutes before working out will  loosen up your tissues and help with improving the range of motion but do not use heat for the first two weeks following surgery (sometimes heat can increase post-operative swelling).   These exercises can be done on a training (exercise) mat, on the floor, on a table or on a bed. Use whatever works the best and is most comfortable for you.    Use music or television while you are exercising so that the exercises are a pleasant break in your day. This will make your life better with the exercises acting as a break in your routine that you can look forward to.   Perform all exercises about fifteen times, three times per day or as directed.  You should exercise both the operative leg and the other leg as well.  Exercises include:   Quad Sets - Tighten up the muscle on the front of the thigh (Quad) and hold for 5-10 seconds.   Straight Leg Raises - With your knee straight (if you were given a brace, keep it on), lift the leg to 60 degrees, hold for 3 seconds, and slowly lower the leg.  Perform this exercise against resistance later as your leg gets stronger.  Leg Slides: Lying on your back, slowly slide your foot toward your buttocks, bending your knee up off the floor (only go as far as is comfortable). Then slowly slide your foot back down until your leg is flat on the floor again.  Angel Wings: Lying on your back spread your legs to the side as far apart as you can without causing discomfort.  Hamstring Strength:  Lying on your back, push your heel against the floor with your leg straight by tightening up the muscles of your buttocks.  Repeat, but this time bend your knee to a comfortable angle, and push your heel against the floor.  You may put a pillow under the heel to make it more comfortable if necessary.   A rehabilitation program following joint replacement surgery can speed recovery and prevent re-injury in the future due to weakened muscles. Contact your doctor or a physical therapist for more  information on knee rehabilitation.    CONSTIPATION  Constipation is defined medically as fewer than three stools per week and severe constipation as less than one stool per week.  Even if you have a regular bowel pattern at home, your normal regimen is likely to be disrupted due to multiple reasons following surgery.  Combination of anesthesia, postoperative narcotics, change in appetite and fluid intake all can affect your bowels.   YOU MUST use at least one of the following options; they are listed in order of increasing strength to get the job done.  They are all available over the counter, and you may need to use some, POSSIBLY even all of these options:    Drink plenty of fluids (prune juice may be helpful) and high fiber foods Colace 100 mg by mouth twice a day  Senokot for constipation as directed and as needed Dulcolax (bisacodyl), take with full glass of water  Miralax (polyethylene glycol) once or twice a day as needed.  If you have tried all these things and are unable to have a bowel movement in the first 3-4 days after surgery call either your surgeon or your primary doctor.    If you experience loose stools or diarrhea,  hold the medications until you stool forms back up.  If your symptoms do not get better within 1 week or if they get worse, check with your doctor.  If you experience "the worst abdominal pain ever" or develop nausea or vomiting, please contact the office immediately for further recommendations for treatment.   ITCHING:  If you experience itching with your medications, try taking only a single pain pill, or even half a pain pill at a time.  You can also use Benadryl over the counter for itching or also to help with sleep.   TED HOSE STOCKINGS:  Use stockings on both legs until for at least 2 weeks or as directed by physician office. They may be removed at night for sleeping.  MEDICATIONS:  See your medication summary on the "After Visit Summary" that nursing will  review with you.  You may have some home medications which will be placed on hold until you complete the course of blood thinner medication.  It is important for you to complete the blood thinner medication as prescribed.  PRECAUTIONS:  If you experience chest pain or shortness of breath - call 911 immediately for transfer to the hospital emergency department.   If you develop a fever greater that 101 F, purulent drainage from wound, increased redness or drainage from wound, foul odor from the wound/dressing, or calf pain - CONTACT YOUR SURGEON.                                                   FOLLOW-UP APPOINTMENTS:  If you do not already have a post-op appointment, please call the office for an appointment to be seen by your surgeon.  Guidelines for how soon to be seen are listed in your "After Visit Summary", but are typically between 1-4 weeks after surgery.  OTHER INSTRUCTIONS:   Knee Replacement:  Do not place pillow under knee, focus on keeping the knee straight while resting. CPM instructions: 0-90 degrees, 2 hours in the morning, 2 hours in the afternoon, and 2 hours in the evening. Place foam block, curve side up under heel at all times except when in CPM or when walking.  DO NOT modify, tear, cut, or change the foam block in any way.  POST-OPERATIVE OPIOID TAPER INSTRUCTIONS: It is important to wean off of your opioid medication as soon as possible. If you do not need pain medication after your surgery it is ok to stop day one. Opioids include: Codeine, Hydrocodone(Norco, Vicodin), Oxycodone(Percocet, oxycontin) and hydromorphone amongst others.  Long term and even short term use of opiods can cause: Increased pain response Dependence Constipation Depression Respiratory depression And more.  Withdrawal symptoms can include Flu like symptoms Nausea, vomiting And more Techniques to manage these symptoms Hydrate well Eat regular healthy meals Stay active Use relaxation  techniques(deep breathing, meditating, yoga) Do Not substitute Alcohol to help with tapering If you have been on opioids for less than two weeks and do not have pain than it is ok to stop all together.  Plan to wean off of opioids This plan should start within one week post op of your joint replacement. Maintain the same interval or time between taking each dose and first decrease the dose.  Cut the total daily intake of opioids by one tablet each day Next start to increase the time between doses.  The last dose that should be eliminated is the evening dose.     MAKE SURE YOU:  Understand these instructions.  Get help right away if you are not doing well or get worse.    Thank you for letting us be a part of your medical care team.  It is a privilege we respect greatly.  We hope these instructions will help you stay on track for a fast and full recovery!   Increase activity slowly as tolerated   Complete by: As directed    Increase activity slowly as tolerated   Complete by: As directed    Post-operative opioid taper instructions:   Complete by: As directed    POST-OPERATIVE OPIOID TAPER INSTRUCTIONS: It is important to wean off of your opioid medication as soon as possible. If you do not need pain medication after your surgery it is ok to stop day one. Opioids include: Codeine, Hydrocodone(Norco, Vicodin), Oxycodone(Percocet, oxycontin) and hydromorphone amongst others.  Long term and even short term use of opiods can cause: Increased pain response Dependence Constipation Depression Respiratory depression And more.  Withdrawal symptoms can include Flu like symptoms Nausea, vomiting And more Techniques to manage these symptoms Hydrate well Eat regular healthy meals Stay active Use relaxation techniques(deep breathing, meditating, yoga) Do Not substitute Alcohol to help with tapering If you have been on opioids for less than two weeks and do not have pain than it is ok to  stop all together.  Plan to wean off of opioids This plan should start within one week post op of your joint replacement. Maintain the same interval or time between taking each dose and first decrease the dose.  Cut the total daily intake of opioids by one tablet each day Next start to increase the time between doses. The last dose that should be eliminated is the evening dose.      Post-operative opioid taper instructions:   Complete by: As directed    POST-OPERATIVE OPIOID TAPER INSTRUCTIONS: It is important to wean off of your opioid medication as soon as possible. If you do not need pain medication after your surgery it is ok to stop day one. Opioids include: Codeine, Hydrocodone(Norco, Vicodin), Oxycodone(Percocet, oxycontin) and hydromorphone amongst others.  Long term and even short term use of opiods can cause: Increased pain response Dependence Constipation Depression Respiratory depression And more.  Withdrawal symptoms can include Flu like symptoms Nausea, vomiting And more Techniques to manage these symptoms Hydrate well Eat regular healthy meals Stay active Use relaxation techniques(deep breathing, meditating, yoga) Do Not substitute Alcohol to help with tapering If you have been on opioids for less than two weeks and do not have pain than it is ok to stop all together.  Plan to wean off of opioids This plan should start within one week post op of your joint replacement. Maintain the same interval or time between taking each dose and first decrease the dose.  Cut the total daily intake of opioids by one tablet each day Next start to increase the time between doses. The last dose that should be eliminated is the evening dose.          Follow-up Information    Marcene Corning, MD. Schedule an appointment as soon as possible for a visit in 2 weeks.   Specialty: Orthopedic Surgery Contact information: [address] [place] [address] [phone]         [facility] Follow up.   Specialty: Home Health Services Why: to  provide home health physical therapy Contact information: [address] [place] [address] [phone]                Signed: Ginger Organ Dhruvan Gullion 02/21/2021, 6:33 AM

## 2021-02-21 NOTE — Plan of Care (Signed)
  Problem: Education: Goal: Knowledge of General Education information will improve Description: Including pain rating scale, medication(s)/side effects and non-pharmacologic comfort measures Outcome: Adequate for Discharge   Problem: Health Behavior/Discharge Planning: Goal: Ability to manage health-related needs will improve Outcome: Adequate for Discharge   Problem: Clinical Measurements: Goal: Ability to maintain clinical measurements within normal limits will improve Outcome: Adequate for Discharge Goal: Will remain free from infection Outcome: Adequate for Discharge Goal: Diagnostic test results will improve Outcome: Adequate for Discharge Goal: Respiratory complications will improve Outcome: Adequate for Discharge Goal: Cardiovascular complication will be avoided Outcome: Adequate for Discharge   Problem: Activity: Goal: Risk for activity intolerance will decrease Outcome: Adequate for Discharge   Problem: Nutrition: Goal: Adequate nutrition will be maintained Outcome: Adequate for Discharge   Problem: Coping: Goal: Level of anxiety will decrease Outcome: Adequate for Discharge   Problem: Elimination: Goal: Will not experience complications related to bowel motility Outcome: Adequate for Discharge Goal: Will not experience complications related to urinary retention Outcome: Adequate for Discharge   Problem: Pain Managment: Goal: General experience of comfort will improve Outcome: Adequate for Discharge   Problem: Education: Goal: Knowledge of the prescribed therapeutic regimen will improve Outcome: Adequate for Discharge Goal: Individualized Educational Video(s) Outcome: Adequate for Discharge   Problem: Activity: Goal: Ability to avoid complications of mobility impairment will improve Outcome: Adequate for Discharge Goal: Range of joint motion will improve Outcome: Adequate for Discharge   Problem: Clinical Measurements: Goal: Postoperative  complications will be avoided or minimized Outcome: Adequate for Discharge   Problem: Pain Management: Goal: Pain level will decrease with appropriate interventions Outcome: Adequate for Discharge Discharge teaching done, written info given.

## 2021-02-21 NOTE — Progress Notes (Signed)
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  Pt ambulated in hallway and performed LE exercises.  Pt provided with HEP handout.  Pt had no further questions and awaits her ride home.   Follow Up Recommendations  Follow surgeon's recommendation for DC plan and follow-up therapies     Equipment Recommendations  Rolling walker with 5" wheels    Recommendations for Other Services       Precautions / Restrictions Precautions Precautions: Fall;Knee Restrictions Weight Bearing Restrictions: No    Mobility  Bed Mobility Overal bed mobility: Needs Assistance Bed Mobility: Supine to Sit     Supine to sit: Supervision          Transfers Overall transfer level: Needs assistance Equipment used: Rolling walker (2 wheeled) Transfers: Sit to/from Stand Sit to Stand: Supervision         General transfer comment: verbal cues for UE and LE positioning  Ambulation/Gait Ambulation/Gait assistance: Min guard;Supervision Gait Distance (Feet): 140 Feet Assistive device: Rolling walker (2 wheeled) Gait Pattern/deviations: Step-to pattern;Decreased stance time - left;Antalgic Gait velocity: decr   General Gait Details: verbal cues for sequence, RW positioning, step length, posture   Stairs             Wheelchair Mobility    Modified Rankin (Stroke Patients Only)       Balance                                            Cognition Arousal/Alertness: Awake/alert Behavior During Therapy: WFL for tasks assessed/performed Overall Cognitive Status: Within Functional Limits for tasks assessed                                        Exercises Total Joint Exercises Ankle Circles/Pumps:  AROM;Both;10 reps Quad Sets: AROM;Both;10 reps Heel Slides: AAROM;Left;10 reps Hip ABduction/ADduction: AAROM;Left;10 reps Straight Leg Raises: AAROM;Left;10 reps    General Comments        Pertinent Vitals/Pain Pain Assessment: 0-10 Pain Score: 5  Pain Location: left knee Pain Descriptors / Indicators: Aching;Sore Pain Intervention(s): Repositioned;Monitored during session;Premedicated before session    Home Living                      Prior Function            PT Goals (current goals can now be found in the care plan section) Progress towards PT goals: Progressing toward goals    Frequency    7X/week      PT Plan Current plan remains appropriate    Co-evaluation              AM-PAC PT "6 Clicks" Mobility   Outcome Measure  Help needed turning from your back to your side while in a flat bed without using bedrails?: A Little Help needed moving from lying on your back to sitting on the side of a flat bed without using bedrails?: A Little Help needed moving to and from a bed to a chair (including  a wheelchair)?: A Little Help needed standing up from a chair using your arms (e.g., wheelchair or bedside chair)?: A Little Help needed to walk in hospital room?: A Little Help needed climbing 3-5 steps with a railing? : A Little 6 Click Score: 18    End of Session Equipment Utilized During Treatment: Gait belt Activity Tolerance: Patient tolerated treatment well Patient left: in chair;with call bell/phone within reach Nurse Communication: Mobility status PT Visit Diagnosis: Other abnormalities of gait and mobility (R26.89)     Time: 7253-6644 PT Time Calculation (min) (ACUTE ONLY): 26 min  Charges:  $Gait Training: 8-22 mins $Therapeutic Exercise: 8-22 mins                     Thomasene Mohair PT, DPT Acute Rehabilitation Services Pager: [phone] Office: [phone]   Maida Sale E 02/21/2021, 4:44 PM

## 2021-03-18 ENCOUNTER — Other Ambulatory Visit (HOSPITAL_COMMUNITY): Payer: Self-pay | Admitting: Orthopaedic Surgery

## 2021-03-18 ENCOUNTER — Ambulatory Visit (HOSPITAL_COMMUNITY)
Admission: RE | Admit: 2021-03-18 | Discharge: 2021-03-18 | Disposition: A | Discharge status: Home / Self Care | Payer: Medicare Other | Source: Ambulatory Visit | Attending: Orthopaedic Surgery | Admitting: Orthopaedic Surgery

## 2021-03-18 ENCOUNTER — Other Ambulatory Visit: Payer: Self-pay

## 2021-03-18 DIAGNOSIS — M79662 Pain in left lower leg: Secondary | ICD-10-CM | POA: Insufficient documentation

## 2021-03-18 DIAGNOSIS — M7989 Other specified soft tissue disorders: Secondary | ICD-10-CM

## 2022-07-01 IMAGING — CR DG CHEST 2V
2 series · 2 of 2 positions shown · non-contrast
Comparison: CT chest 06/02/2020

CLINICAL DATA: Preop left knee replacement

EXAM:
CHEST - 2 VIEW

[w chest pa]
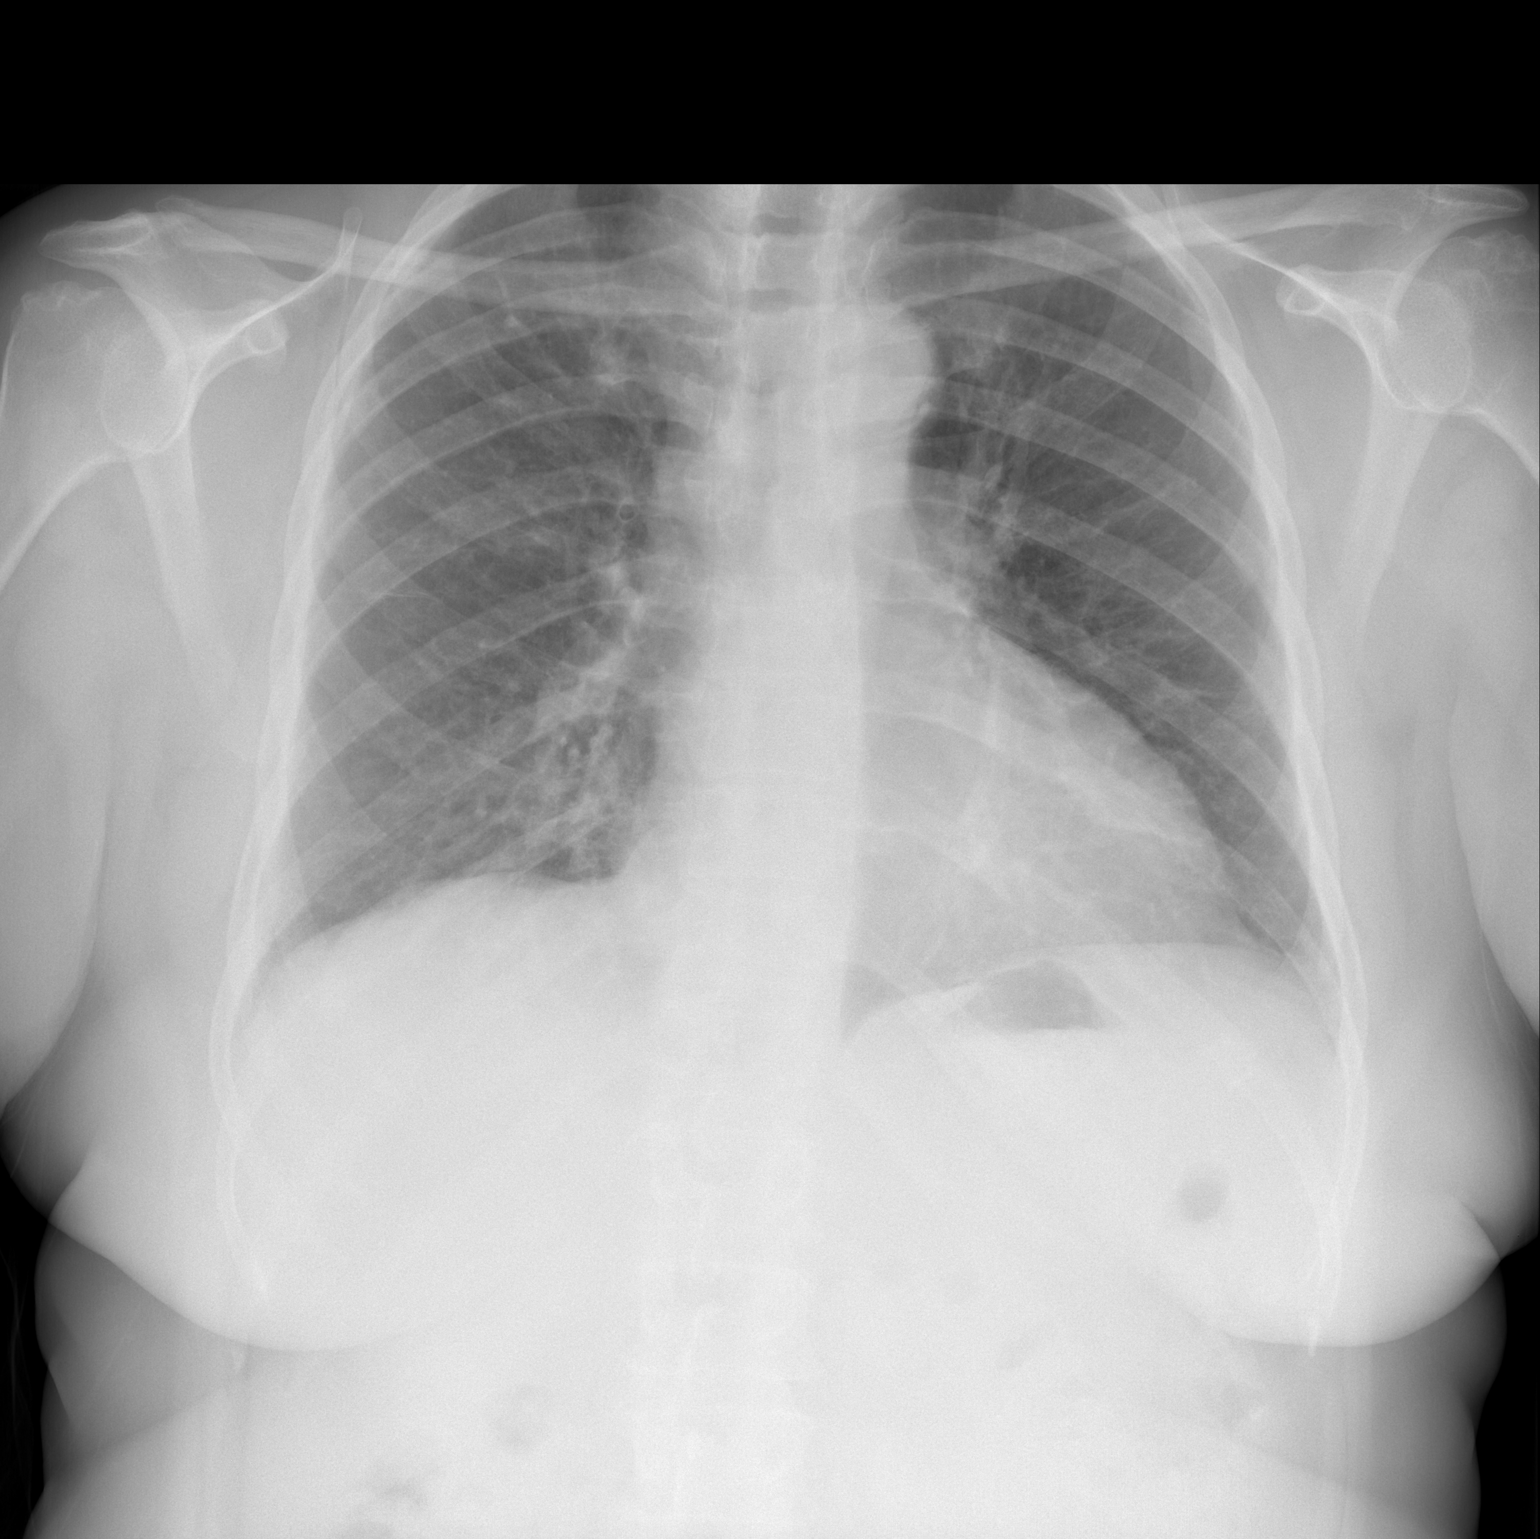

[w chest lat]
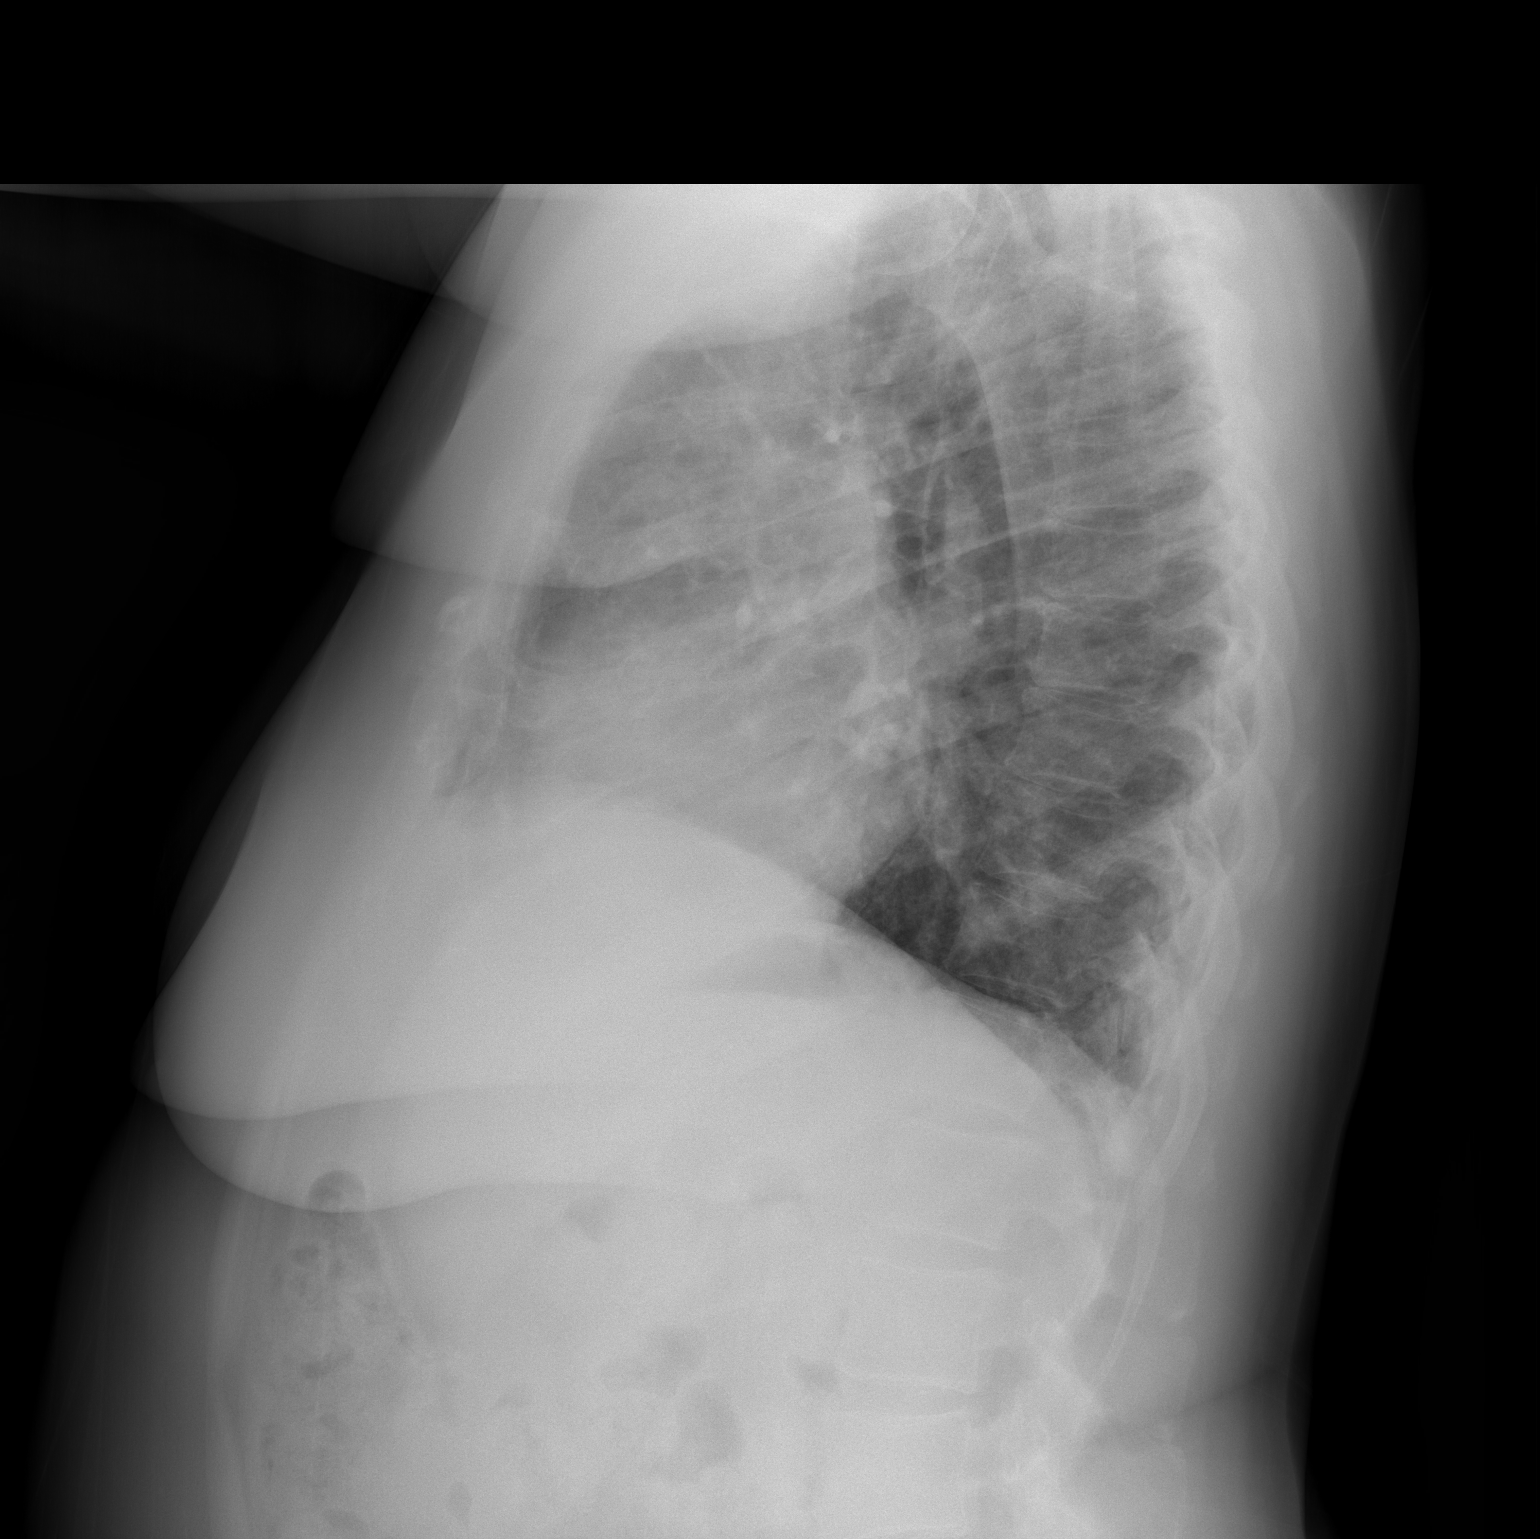

[2 of 2 positions shown; findings below may reference images not displayed]

FINDINGS: Mild bilateral chronic interstitial lung disease. No focal
consolidation. No pleural effusion or pneumothorax. Heart and
mediastinal contours are unremarkable.

No acute osseous abnormality.
IMPRESSION: No active cardiopulmonary disease.
